# Patient Record
Sex: Male | Born: 1985 | Race: Black or African American | Marital: Married | Smoking: Current every day smoker
Health system: Southern US, Community
[De-identification: ages and names within clinical notes are randomized; demographics above are authoritative.]

## PROBLEM LIST (undated history)

## (undated) DIAGNOSIS — M419 Scoliosis, unspecified: Secondary | ICD-10-CM

## (undated) DIAGNOSIS — D573 Sickle-cell trait: Secondary | ICD-10-CM

## (undated) DIAGNOSIS — F419 Anxiety disorder, unspecified: Secondary | ICD-10-CM

## (undated) DIAGNOSIS — R519 Headache, unspecified: Secondary | ICD-10-CM

## (undated) DIAGNOSIS — F1010 Alcohol abuse, uncomplicated: Secondary | ICD-10-CM

## (undated) DIAGNOSIS — F32A Depression, unspecified: Secondary | ICD-10-CM

## (undated) DIAGNOSIS — E7800 Pure hypercholesterolemia, unspecified: Secondary | ICD-10-CM

## (undated) DIAGNOSIS — K219 Gastro-esophageal reflux disease without esophagitis: Secondary | ICD-10-CM

## (undated) HISTORY — PX: WISDOM TOOTH EXTRACTION: SHX21

## (undated) HISTORY — DX: Gastro-esophageal reflux disease without esophagitis: K21.9

---

## 2016-04-10 ENCOUNTER — Emergency Department
Admission: EM | Admit: 2016-04-10 | Discharge: 2016-04-10 | Disposition: A | Discharge status: Home / Self Care | Payer: Self-pay | Attending: Student in an Organized Health Care Education/Training Program | Admitting: Student in an Organized Health Care Education/Training Program

## 2016-04-10 ENCOUNTER — Encounter: Payer: Self-pay | Admitting: Emergency Medicine

## 2016-04-10 ENCOUNTER — Emergency Department: Payer: Self-pay

## 2016-04-10 DIAGNOSIS — X58XXXA Exposure to other specified factors, initial encounter: Secondary | ICD-10-CM | POA: Insufficient documentation

## 2016-04-10 DIAGNOSIS — Y9339 Activity, other involving climbing, rappelling and jumping off: Secondary | ICD-10-CM | POA: Insufficient documentation

## 2016-04-10 DIAGNOSIS — S9031XA Contusion of right foot, initial encounter: Secondary | ICD-10-CM | POA: Insufficient documentation

## 2016-04-10 DIAGNOSIS — Y929 Unspecified place or not applicable: Secondary | ICD-10-CM | POA: Insufficient documentation

## 2016-04-10 DIAGNOSIS — Y999 Unspecified external cause status: Secondary | ICD-10-CM | POA: Insufficient documentation

## 2016-04-10 MED ORDER — NAPROXEN 500 MG PO TABS: 500.0000 mg | ORAL_TABLET | Freq: Two times a day (BID) | ORAL | 0 refills | Status: DC

## 2016-04-10 NOTE — ED Triage Notes (Signed)
Patient reports right foot pain after jumping onto concrete.  Patient states, "I think I landed wrong."  Patient is in no obvious distress at this time.

## 2016-04-10 NOTE — ED Provider Notes (Signed)
[facility] Emergency Department Provider Note ____________________________________________  Time seen: Approximately 3:53 PM  I have reviewed the triage vital signs and the nursing notes.   HISTORY  Chief Complaint Foot Pain    HPI Keyondre Hepburn is a 31 y.o. male who presents to the emergency department for evaluation of right foot pain after jumping over some mud last night and landing on a curb. Pain increases with ambulation. He has not taken anything for pain.  History reviewed. No pertinent past medical history.  There are no active problems to display for this patient.   History reviewed. No pertinent surgical history.  Prior to Admission medications   Medication Sig Start Date End Date Taking? Authorizing Provider  naproxen (NAPROSYN) 500 MG tablet Take 1 tablet (500 mg total) by mouth 2 (two) times daily with a meal. 04/10/16   Chinita Pester, FNP    Allergies Patient has no known allergies.  No family history on file.  Social History Social History  Substance Use Topics  . Smoking status: Never Smoker  . Smokeless tobacco: Never Used  . Alcohol use No    Review of Systems Constitutional: No recent illness. Cardiovascular: Denies chest pain or palpitations. Respiratory: Denies shortness of breath. Musculoskeletal: Pain in Right foot Skin: Negative for rash, wound, lesion. Neurological: Negative for focal weakness or numbness.  ____________________________________________   PHYSICAL EXAM:  VITAL SIGNS: ED Triage Vitals  Enc Vitals Group     BP 04/10/16 1509 (!) 139/91     Pulse Rate 04/10/16 1509 (!) 115     Resp 04/10/16 1509 20     Temp 04/10/16 1509 98.2 F (36.8 C)     Temp Source 04/10/16 1509 Oral     SpO2 04/10/16 1509 100 %     Weight 04/10/16 1509 160 lb (72.6 kg)     Height 04/10/16 1509 5\' 4"  (1.626 m)     Head Circumference --      Peak Flow --      Pain Score 04/10/16 1507 8     Pain Loc --    Pain Edu? --      Excl. in GC? --     Constitutional: Alert and oriented. Well appearing and in no acute distress. Eyes: Conjunctivae are normal. EOMI. Head: Atraumatic. Neck: No stridor.  Respiratory: Normal respiratory effort.   Musculoskeletal: Right foot demonstrates swelling and tenderness over the fourth and fifth metatarsals with out noted deformity. Neurologic:  Normal speech and language. No gross focal neurologic deficits are appreciated. Speech is normal. No gait instability. Skin:  Skin is warm, dry and intact. Atraumatic. Psychiatric: Mood and affect are normal. Speech and behavior are normal.  ____________________________________________   LABS (all labs ordered are listed, but only abnormal results are displayed)  Labs Reviewed - No data to display ____________________________________________  RADIOLOGY  No acute bony abnormality of the right foot per radiology. ____________________________________________   PROCEDURES  Procedure(s) performed: Ace bandage and cashew applied by ER tech. Patient neurovascularly intact post-application.   ____________________________________________   INITIAL IMPRESSION / ASSESSMENT AND PLAN / ED COURSE     Pertinent labs & imaging results that were available during my care of the patient were reviewed by me and considered in my medical decision making (see chart for details).  31 year old male presents to the emergency department for evaluation of right foot pain. Exam and x-ray are consistent with contusion and sprain. He was instructed to follow-up with podiatry for symptoms that are  not improving over the week. He was instructed to take the Naprosyn as prescribed. He was advised to return to the emergency department for symptoms change or worsen if he is unable schedule an appointment. ____________________________________________   FINAL CLINICAL IMPRESSION(S) / ED DIAGNOSES  Final diagnoses:  Contusion of right foot,  initial encounter       Chinita PesterCari B Anai Lipson, FNP 04/10/16 2149    Willy EddyPatrick Robinson, MD 04/10/16 2359

## 2017-01-16 ENCOUNTER — Encounter: Payer: Self-pay | Admitting: Emergency Medicine

## 2017-01-16 ENCOUNTER — Emergency Department: Payer: Self-pay

## 2017-01-16 ENCOUNTER — Emergency Department
Admission: EM | Admit: 2017-01-16 | Discharge: 2017-01-16 | Disposition: A | Discharge status: Home / Self Care | Payer: Self-pay | Attending: Student in an Organized Health Care Education/Training Program | Admitting: Student in an Organized Health Care Education/Training Program

## 2017-01-16 ENCOUNTER — Other Ambulatory Visit: Payer: Self-pay

## 2017-01-16 DIAGNOSIS — F17210 Nicotine dependence, cigarettes, uncomplicated: Secondary | ICD-10-CM | POA: Insufficient documentation

## 2017-01-16 DIAGNOSIS — R079 Chest pain, unspecified: Secondary | ICD-10-CM | POA: Insufficient documentation

## 2017-01-16 LAB — FIBRIN DERIVATIVES D-DIMER (ARMC ONLY): FIBRIN DERIVATIVES D-DIMER (ARMC): 215.72 ng{FEU}/mL (ref 0.00–499.00)

## 2017-01-16 LAB — COMPREHENSIVE METABOLIC PANEL
ALBUMIN: 4.2 g/dL (ref 3.5–5.0)
ALT: 22 U/L (ref 17–63)
AST: 28 U/L (ref 15–41)
Alkaline Phosphatase: 78 U/L (ref 38–126)
Anion gap: 8 (ref 5–15)
BUN: 12 mg/dL (ref 6–20)
CHLORIDE: 105 mmol/L (ref 101–111)
CO2: 23 mmol/L (ref 22–32)
Calcium: 9.6 mg/dL (ref 8.9–10.3)
Creatinine, Ser: 0.82 mg/dL (ref 0.61–1.24)
GFR calc Af Amer: >60 mL/min (ref >60)
Glucose, Bld: 136 mg/dL — ABNORMAL HIGH (ref 65–99)
POTASSIUM: 3.7 mmol/L (ref 3.5–5.1)
SODIUM: 136 mmol/L (ref 135–145)
Total Bilirubin: 0.6 mg/dL (ref 0.3–1.2)
Total Protein: 7.7 g/dL (ref 6.5–8.1)

## 2017-01-16 LAB — TROPONIN I: Troponin I: <0.03 ng/mL (ref <0.03)

## 2017-01-16 LAB — CBC
HEMATOCRIT: 44.4 % (ref 40.0–52.0)
HEMOGLOBIN: 15.6 g/dL (ref 13.0–18.0)
MCH: 30 pg (ref 26.0–34.0)
MCHC: 35.2 g/dL (ref 32.0–36.0)
MCV: 85.1 fL (ref 80.0–100.0)
PLATELETS: 265 10*3/uL (ref 150–440)
RBC: 5.22 MIL/uL (ref 4.40–5.90)
RDW: 13.7 % (ref 11.5–14.5)
WBC: 6.1 10*3/uL (ref 3.8–10.6)

## 2017-01-16 MED ORDER — TRAMADOL HCL 50 MG PO TABS: 50.0000 mg | ORAL_TABLET | Freq: Four times a day (QID) | ORAL | 0 refills | Status: AC | PRN

## 2017-01-16 MED ORDER — IPRATROPIUM-ALBUTEROL 0.5-2.5 (3) MG/3ML IN SOLN
3.0000 mL | Freq: Once | RESPIRATORY_TRACT | Status: AC
  Administered 2017-01-16: 3 mL via RESPIRATORY_TRACT
  Filled 2017-01-16: qty 3

## 2017-01-16 NOTE — ED Notes (Signed)
Patient called by tech for EKG. Patient is in parking lot on phone and does not respond to summons.

## 2017-01-16 NOTE — ED Provider Notes (Signed)
[facility] Emergency Department Provider Note    First MD Initiated Contact with Patient 01/16/17 1840     (approximate)  I have reviewed the triage vital signs and the nursing notes.   HISTORY  Chief Complaint Generalized Body Aches and Chest Pain    HPI Brian Saunders is a 31 y.o. male with half pack per day smoking history but no other recent admissions to hospital her chronic medical conditions presents with generalized body aches particular on the left side of his hemithorax for the last 4 days.  Pain is worse with deep inspiration and does have some mild shortness of breath.  Denies any chest pain at rest.  Denies any history of blood clots.  Pain is worse with deep inspiration describes it as a stabbing pain.  No cough, fevers, nausea or vomiting.  History reviewed. No pertinent past medical history. No family history on file. History reviewed. No pertinent surgical history. There are no active problems to display for this patient.     Prior to Admission medications   Medication Sig Start Date End Date Taking? Authorizing Provider  naproxen (NAPROSYN) 500 MG tablet Take 1 tablet (500 mg total) by mouth 2 (two) times daily with a meal. 04/10/16   Triplett, Cari B, FNP  traMADol (ULTRAM) 50 MG tablet Take 1 tablet (50 mg total) by mouth every 6 (six) hours as needed. 01/16/17 01/16/18  Willy Eddyobinson, Seabron Iannello, MD    Allergies Patient has no known allergies.    Social History Social History   Tobacco Use  . Smoking status: Current Every Day Smoker    Packs/day: 0.50    Types: Cigarettes  . Smokeless tobacco: Never Used  Substance Use Topics  . Alcohol use: No  . Drug use: Not on file    Review of Systems Patient denies headaches, rhinorrhea, blurry vision, numbness, shortness of breath, chest pain, edema, cough, abdominal pain, nausea, vomiting, diarrhea, dysuria, fevers, rashes or hallucinations unless otherwise stated above in  HPI. ____________________________________________   PHYSICAL EXAM:  VITAL SIGNS: Vitals:   01/16/17 1617  BP: (!) 157/92  Pulse: (!) 103  Resp: 20  Temp: 98.7 F (37.1 C)  SpO2: 100%    Constitutional: Alert and oriented. Well appearing and in no acute distress. Eyes: Conjunctivae are normal.  Head: Atraumatic. Nose: No congestion/rhinnorhea. Mouth/Throat: Mucous membranes are moist.   Neck: No stridor. Painless ROM.  Cardiovascular: Normal rate, regular rhythm. Grossly normal heart sounds.  Good peripheral circulation. Respiratory: Normal respiratory effort.  No retractions. Lungs CTAB. Gastrointestinal: Soft and nontender. No distention. No abdominal bruits. No CVA tenderness. Genitourinary:  Musculoskeletal: No lower extremity tenderness nor edema.  No joint effusions. Neurologic:  Normal speech and language. No gross focal neurologic deficits are appreciated. No facial droop Skin:  Skin is warm, dry and intact. No rash noted. Psychiatric: Mood and affect are normal. Speech and behavior are normal.  ____________________________________________   LABS (all labs ordered are listed, but only abnormal results are displayed)  Results for orders placed or performed during the hospital encounter of 01/16/17 (from the past 24 hour(s))  CBC     Status: None   Collection Time: 01/16/17  4:17 PM  Result Value Ref Range   WBC 6.1 3.8 - 10.6 K/uL   RBC 5.22 4.40 - 5.90 MIL/uL   Hemoglobin 15.6 13.0 - 18.0 g/dL   HCT 56.244.4 13.040.0 - 86.552.0 %   MCV 85.1 80.0 - 100.0 fL   MCH 30.0 26.0 -  34.0 pg   MCHC 35.2 32.0 - 36.0 g/dL   RDW 40.913.7 81.111.5 - 91.414.5 %   Platelets 265 150 - 440 K/uL  Comprehensive metabolic panel     Status: Abnormal   Collection Time: 01/16/17  4:17 PM  Result Value Ref Range   Sodium 136 135 - 145 mmol/L   Potassium 3.7 3.5 - 5.1 mmol/L   Chloride 105 101 - 111 mmol/L   CO2 23 22 - 32 mmol/L   Glucose, Bld 136 (H) 65 - 99 mg/dL   BUN 12 6 - 20 mg/dL    Creatinine, Ser 7.820.82 0.61 - 1.24 mg/dL   Calcium 9.6 8.9 - 95.610.3 mg/dL   Total Protein 7.7 6.5 - 8.1 g/dL   Albumin 4.2 3.5 - 5.0 g/dL   AST 28 15 - 41 U/L   ALT 22 17 - 63 U/L   Alkaline Phosphatase 78 38 - 126 U/L   Total Bilirubin 0.6 0.3 - 1.2 mg/dL   GFR calc non Af Amer >60 >60 mL/min   GFR calc Af Amer >60 >60 mL/min   Anion gap 8 5 - 15  Troponin I     Status: None   Collection Time: 01/16/17  4:17 PM  Result Value Ref Range   Troponin I <0.03 <0.03 ng/mL  Fibrin derivatives D-Dimer ([facility] only)     Status: None   Collection Time: 01/16/17  7:51 PM  Result Value Ref Range   Fibrin derivatives D-dimer ([facility]) 215.72 0.00 - 499.00 ng/mL (FEU)   ____________________________________________  EKG My review and personal interpretation at Time: 16:21   Indication: chest pain  Rate: 100  Rhythm: sinus Axis: normal Other: no stemi, normal intervals, ____________________________________________  RADIOLOGY  I personally reviewed all radiographic images ordered to evaluate for the above acute complaints and reviewed radiology reports and findings.  These findings were personally discussed with the patient.  Please see medical record for radiology report.  ____________________________________________   PROCEDURES  Procedure(s) performed:  Procedures    Critical Care performed: no ____________________________________________   INITIAL IMPRESSION / ASSESSMENT AND PLAN / ED COURSE  Pertinent labs & imaging results that were available during my care of the patient were reviewed by me and considered in my medical decision making (see chart for details).  DDX: ACS, pericarditis, esophagitis, boerhaaves, pe, dissection, pna, bronchitis, costochondritis   Brian Saunders is a 31 y.o. who presents to the ED with chest pain as described above.  Patient is low risk by Wells criteria but is tachycardic therefore d-dimer was sent to further stratify for pulmonary embolism.   D-dimer is negative.  His EKG shows no evidence of acute ischemia and patient is without any pressure or pain at rest.  KG normal after 2 days of pain no exertional pain.  Only with deep inspiration.  Pain is reproducible with palpation.  Most consistent with costochondritis possible pleuritis.  Patient will be discharged with outpatient follow-up.  Discussed signs and symptoms for which he should return immediately to the hospital.     ____________________________________________   FINAL CLINICAL IMPRESSION(S) / ED DIAGNOSES  Final diagnoses:  Chest pain, unspecified type      NEW MEDICATIONS STARTED DURING THIS VISIT:  This SmartLink is deprecated. Use AVSMEDLIST instead to display the medication list for a patient.   Note:  This document was prepared using Dragon voice recognition software and may include unintentional dictation errors.    Willy Eddyobinson, Raekwan Spelman, MD 01/16/17 2115

## 2017-01-16 NOTE — ED Triage Notes (Addendum)
Generalized body aches and pain with inspiration x 4 days. No injury. Patient states he does not have a cough. States pain does not increase with palpation. States pain is "inside".

## 2017-01-16 NOTE — ED Notes (Signed)
Pt presents today with chest pain. Pt states that pain has been occurring for 4 days.  Pt is NAD, but states it hurts when he breaths. Pt is A/O with family at bedside.

## 2017-01-16 NOTE — ED Notes (Signed)
Pt called and did not come right away he was standing up talking, with others in NAD.

## 2017-06-15 ENCOUNTER — Emergency Department
Admission: EM | Admit: 2017-06-15 | Discharge: 2017-06-15 | Disposition: A | Discharge status: Home / Self Care | Payer: Self-pay | Attending: Emergency Medicine | Admitting: Emergency Medicine

## 2017-06-15 ENCOUNTER — Other Ambulatory Visit: Payer: Self-pay

## 2017-06-15 ENCOUNTER — Encounter: Payer: Self-pay | Admitting: Emergency Medicine

## 2017-06-15 DIAGNOSIS — F17210 Nicotine dependence, cigarettes, uncomplicated: Secondary | ICD-10-CM | POA: Insufficient documentation

## 2017-06-15 DIAGNOSIS — H66002 Acute suppurative otitis media without spontaneous rupture of ear drum, left ear: Secondary | ICD-10-CM | POA: Insufficient documentation

## 2017-06-15 DIAGNOSIS — Z79899 Other long term (current) drug therapy: Secondary | ICD-10-CM | POA: Insufficient documentation

## 2017-06-15 MED ORDER — AMOXICILLIN 500 MG PO TABS: 500.0000 mg | ORAL_TABLET | Freq: Three times a day (TID) | ORAL | 0 refills | Status: AC

## 2017-06-15 NOTE — ED Triage Notes (Signed)
Patient ambulatory to triage with steady gait, without difficulty or distress noted; pt c/o left earache and drainage several days

## 2017-06-15 NOTE — ED Notes (Signed)
Pt reports feeling pressure in left ear with no decreased hearing sensation and feels relief when he yawns.

## 2017-06-15 NOTE — ED Provider Notes (Signed)
[facility] Emergency Department Provider Note  ____________________________________________  Time seen: Approximately 10:53 PM  I have reviewed the triage vital signs and the nursing notes.   HISTORY  Chief Complaint Otalgia    HPI Brian Saunders is a 32 y.o. male presents to the emergency department with 10 out of 10 aching and throbbing left ear pain for the past 3 days.  Patient has noticed no discharge from the ear or diminished hearing.  Patient reports symptoms consistent with an upper respiratory tract infection approximately 5 days ago.  He has not noticed fever at home.  No alleviating measures have been attempted.   History reviewed. No pertinent past medical history.  There are no active problems to display for this patient.   History reviewed. No pertinent surgical history.  Prior to Admission medications   Medication Sig Start Date End Date Taking? Authorizing Provider  amoxicillin (AMOXIL) 500 MG tablet Take 1 tablet (500 mg total) by mouth 3 (three) times daily for 7 days. 06/15/17 06/22/17  Orvil FeilWoods, Jaclyn M, PA-C  naproxen (NAPROSYN) 500 MG tablet Take 1 tablet (500 mg total) by mouth 2 (two) times daily with a meal. 04/10/16   Triplett, Cari B, FNP  traMADol (ULTRAM) 50 MG tablet Take 1 tablet (50 mg total) by mouth every 6 (six) hours as needed. 01/16/17 01/16/18  Willy Eddyobinson, Patrick, MD    Allergies Patient has no known allergies.  No family history on file.  Social History Social History   Tobacco Use  . Smoking status: Current Every Day Smoker    Packs/day: 0.50    Types: Cigarettes  . Smokeless tobacco: Never Used  Substance Use Topics  . Alcohol use: No  . Drug use: Not on file     Review of Systems  Constitutional: No fever/chills Eyes: No visual changes. No discharge ENT: Patient has left ear pain.  Cardiovascular: no chest pain. Respiratory: no cough. No SOB. Gastrointestinal: No abdominal pain.  No nausea, no  vomiting.  No diarrhea.  No constipation. Genitourinary: Negative for dysuria. No hematuria Musculoskeletal: Negative for musculoskeletal pain. Skin: Negative for rash, abrasions, lacerations, ecchymosis. Neurological: Negative for headaches, focal weakness or numbness.   ____________________________________________   PHYSICAL EXAM:  VITAL SIGNS: ED Triage Vitals  Enc Vitals Group     BP 06/15/17 1934 (!) 149/84     Pulse Rate 06/15/17 1934 (!) 110     Resp 06/15/17 1934 18     Temp 06/15/17 1934 99.2 F (37.3 C)     Temp Source 06/15/17 1934 Oral     SpO2 06/15/17 1934 100 %     Weight 06/15/17 1933 182 lb (82.6 kg)     Height 06/15/17 1933 5\' 4"  (1.626 m)     Head Circumference --      Peak Flow --      Pain Score 06/15/17 1933 6     Pain Loc --      Pain Edu? --      Excl. in GC? --      Constitutional: Alert and oriented. Well appearing and in no acute distress. Eyes: Conjunctivae are normal. PERRL. EOMI. Head: Atraumatic. ENT:      Ears: Left tympanic membrane is erythematous and effused.  Right TM is pearly.      Nose: No congestion/rhinnorhea.      Mouth/Throat: Mucous membranes are moist.  Cardiovascular: Normal rate, regular rhythm. Normal S1 and S2.  Good peripheral circulation. Respiratory: Normal respiratory effort without tachypnea  or retractions. Lungs CTAB. Good air entry to the bases with no decreased or absent breath sounds. Gastrointestinal: Bowel sounds 4 quadrants. Soft and nontender to palpation. No guarding or rigidity. No palpable masses. No distention. No CVA tenderness. Musculoskeletal: Full range of motion to all extremities. No gross deformities appreciated. Neurologic:  Normal speech and language. No gross focal neurologic deficits are appreciated.  Skin:  Skin is warm, dry and intact. No rash noted.  ___________________________________ LABS (all labs ordered are listed, but only abnormal results are displayed)  Labs Reviewed - No data  to display ____________________________________________  EKG   ____________________________________________  RADIOLOGY   No results found.  ____________________________________________    PROCEDURES  Procedure(s) performed:    Procedures    Medications - No data to display   ____________________________________________   INITIAL IMPRESSION / ASSESSMENT AND PLAN / ED COURSE  Pertinent labs & imaging results that were available during my care of the patient were reviewed by me and considered in my medical decision making (see chart for details).  Review of the [place] CSRS was performed in accordance of the [facility] prior to dispensing any controlled drugs.     Assessment and plan Left otitis media Patient presents to the emergency department with  left ear pain.  Physical exam findings are consistent with otitis media.  Patient was discharged with amoxicillin.  All patient questions were answered.   ____________________________________________  FINAL CLINICAL IMPRESSION(S) / ED DIAGNOSES  Final diagnoses:  Acute suppurative otitis media of left ear without spontaneous rupture of tympanic membrane, recurrence not specified      NEW MEDICATIONS STARTED DURING THIS VISIT:  ED Discharge Orders        Ordered    amoxicillin (AMOXIL) 500 MG tablet  3 times daily     06/15/17 2059          This chart was dictated using voice recognition software/Dragon. Despite best efforts to proofread, errors can occur which can change the meaning. Any change was purely unintentional.    Orvil Feil, PA-C 06/15/17 2257    Don Perking, [place], MD 06/16/17 [id number]

## 2018-02-26 ENCOUNTER — Emergency Department
Admission: EM | Admit: 2018-02-26 | Discharge: 2018-02-26 | Disposition: A | Discharge status: Home / Self Care | Payer: Self-pay | Attending: Emergency Medicine | Admitting: Emergency Medicine

## 2018-02-26 ENCOUNTER — Encounter: Payer: Self-pay | Admitting: Emergency Medicine

## 2018-02-26 ENCOUNTER — Other Ambulatory Visit: Payer: Self-pay

## 2018-02-26 DIAGNOSIS — M67431 Ganglion, right wrist: Secondary | ICD-10-CM | POA: Insufficient documentation

## 2018-02-26 DIAGNOSIS — F17210 Nicotine dependence, cigarettes, uncomplicated: Secondary | ICD-10-CM | POA: Insufficient documentation

## 2018-02-26 MED ORDER — NAPROXEN 500 MG PO TABS: 500.0000 mg | ORAL_TABLET | Freq: Two times a day (BID) | ORAL | Status: DC

## 2018-02-26 NOTE — ED Triage Notes (Signed)
R wrist pain x 3 months. No injury. Small lump back of wrist.

## 2018-02-26 NOTE — ED Provider Notes (Signed)
[facility] Emergency Department Provider Note   ____________________________________________   First MD Initiated Contact with Patient 02/26/18 1335     (approximate)  I have reviewed the triage vital signs and the nursing notes.   HISTORY  Chief Complaint Wrist Pain    HPI Brian Saunders is a 33 y.o. male patient presents with right wrist pain for 3 months.  Patient denies provocative or complaint.  Patient did pain increased secondary to a small lump on the back of his wrist.  Patient is right-hand dominant.  Patient gives a history repetitive flexion extension of the wrist.  Patient also has a history of a ganglion cyst in the same area.  Patient state lesion was aspirated 3 years ago and no complaints until last 3 months.  Patient denies loss sensation or loss of function.  Patient rates his pain as a 2/10.  Patient described the pain is "achy".  No palliative measure for complaint.  History reviewed. No pertinent past medical history.  There are no active problems to display for this patient.   History reviewed. No pertinent surgical history.  Prior to Admission medications   Medication Sig Start Date End Date Taking? Authorizing Provider  naproxen (NAPROSYN) 500 MG tablet Take 1 tablet (500 mg total) by mouth 2 (two) times daily with a meal. 04/10/16   Triplett, Cari B, FNP  naproxen (NAPROSYN) 500 MG tablet Take 1 tablet (500 mg total) by mouth 2 (two) times daily with a meal. 02/26/18   Joni Reining, PA-C    Allergies Patient has no known allergies.  No family history on file.  Social History Social History   Tobacco Use  . Smoking status: Current Every Day Smoker    Packs/day: 0.50    Types: Cigarettes  . Smokeless tobacco: Never Used  Substance Use Topics  . Alcohol use: No  . Drug use: Not on file    Review of Systems Constitutional: No fever/chills Eyes: No visual changes. ENT: No sore throat. Cardiovascular: Denies  chest pain. Respiratory: Denies shortness of breath. Gastrointestinal: No abdominal pain.  No nausea, no vomiting.  No diarrhea.  No constipation. Genitourinary: Negative for dysuria. Musculoskeletal: Right wrist pain. Skin: Negative for rash.  Nodule lesion dorsum right wrist Neurological: Negative for headaches, focal weakness or numbness.   ____________________________________________   PHYSICAL EXAM:  VITAL SIGNS: ED Triage Vitals  Enc Vitals Group     BP 02/26/18 1318 137/89     Pulse Rate 02/26/18 1318 98     Resp 02/26/18 1318 18     Temp 02/26/18 1318 98.4 F (36.9 C)     Temp Source 02/26/18 1318 Oral     SpO2 02/26/18 1318 96 %     Weight 02/26/18 1320 170 lb (77.1 kg)     Height 02/26/18 1320 5\' 5"  (1.651 m)     Head Circumference --      Peak Flow --      Pain Score 02/26/18 1319 2     Pain Loc --      Pain Edu? --      Excl. in GC? --    Constitutional: Alert and oriented. Well appearing and in no acute distress. Cardiovascular: Normal rate, regular rhythm. Grossly normal heart sounds.  Good peripheral circulation. Respiratory: Normal respiratory effort.  No retractions. Lungs CTAB. Musculoskeletal: No obvious deformity to the right wrist/hand.  Patient has full equal range of motion.  Patient is tender palpation dorsal aspect of the  right wrist. Neurologic:  Normal speech and language. No gross focal neurologic deficits are appreciated. No gait instability. Skin:  Skin is warm, dry and intact. No rash noted.  Nodule lesion dorsal aspect of the right wrist. Psychiatric: Mood and affect are normal. Speech and behavior are normal.  ____________________________________________   LABS (all labs ordered are listed, but only abnormal results are displayed)  Labs Reviewed - No data to display ____________________________________________  EKG   ____________________________________________  RADIOLOGY  ED MD interpretation:    Official radiology  report(s): No results found.  ____________________________________________   PROCEDURES  Procedure(s) performed: None  Procedures  Critical Care performed: No  ____________________________________________   INITIAL IMPRESSION / ASSESSMENT AND PLAN / ED COURSE  As part of my medical decision making, I reviewed the following data within the electronic MEDICAL RECORD NUMBER    Patient presents with right wrist pain for 3 months.  Patient has a nodule lesion on the dorsal aspect of the right wrist consistent with ganglion cyst.  Patient given discharge care instruction.  Patient advised follow orthopedic for definitive evaluation and treatment.      ____________________________________________   FINAL CLINICAL IMPRESSION(S) / ED DIAGNOSES  Final diagnoses:  Ganglion cyst of dorsum of right wrist     ED Discharge Orders         Ordered    naproxen (NAPROSYN) 500 MG tablet  2 times daily with meals     02/26/18 1356           Note:  This document was prepared using Dragon voice recognition software and may include unintentional dictation errors.    Joni ReiningSmith, Jasani Lengel K, PA-C 02/26/18 1400    Dionne BucySiadecki, Sebastian, MD 02/26/18 [phone]

## 2018-03-24 ENCOUNTER — Emergency Department
Admission: EM | Admit: 2018-03-24 | Discharge: 2018-03-24 | Disposition: A | Discharge status: Home / Self Care | Payer: Self-pay | Attending: Emergency Medicine | Admitting: Emergency Medicine

## 2018-03-24 ENCOUNTER — Other Ambulatory Visit: Payer: Self-pay

## 2018-03-24 DIAGNOSIS — H1033 Unspecified acute conjunctivitis, bilateral: Secondary | ICD-10-CM | POA: Insufficient documentation

## 2018-03-24 DIAGNOSIS — H109 Unspecified conjunctivitis: Secondary | ICD-10-CM

## 2018-03-24 DIAGNOSIS — F17210 Nicotine dependence, cigarettes, uncomplicated: Secondary | ICD-10-CM | POA: Insufficient documentation

## 2018-03-24 DIAGNOSIS — Z79899 Other long term (current) drug therapy: Secondary | ICD-10-CM | POA: Insufficient documentation

## 2018-03-24 MED ORDER — TETRAHYDROZOLINE-ZN SULFATE 0.05-0.25 % OP SOLN: 2.0000 [drp] | Freq: Three times a day (TID) | OPHTHALMIC | 0 refills | Status: DC | PRN

## 2018-03-24 MED ORDER — ERYTHROMYCIN 5 MG/GM OP OINT: 1.0000 "application " | TOPICAL_OINTMENT | Freq: Four times a day (QID) | OPHTHALMIC | 0 refills | Status: DC

## 2018-03-24 NOTE — ED Notes (Signed)
Gave the pt a cup of water.

## 2018-03-24 NOTE — ED Notes (Signed)
Pt has left eye redness and itchyness, pt states this morning when he woke up left eye was very crusty. He used warm rag to open eye. Pt states he has noticed "eye goo" coming from eye.

## 2018-03-24 NOTE — ED Triage Notes (Signed)
Left eye itching that started at 3AM. Redness. Pt alert and oriented X4, active, cooperative, pt in NAD. RR even and unlabored, color WNL.

## 2018-03-24 NOTE — ED Provider Notes (Signed)
[facility] Emergency Department Provider Note  ____________________________________________  Time seen: Approximately 3:35 PM  I have reviewed the triage vital signs and the nursing notes.   HISTORY  Chief Complaint Eye Problem    HPI Brian Saunders is a 33 y.o. male that presents to the emergency department for evaluation of itching, crusting, and red eye since 3 am. He has still noticed white drainage this morning. Eye has been watering this afternoon. He does not wear contacts or glasses. He works at [employer] and cannot return to [employer] until symptoms have resolved. No foreign body sensation. No eye pain. No visual changes, floaters, flashers, nausea, vomiting.    History reviewed. No pertinent past medical history.  There are no active problems to display for this patient.   History reviewed. No pertinent surgical history.  Prior to Admission medications   Medication Sig Start Date End Date Taking? Authorizing Provider  erythromycin ophthalmic ointment Place 1 application into the right eye 4 (four) times daily. 03/24/18   Enid Derry, PA-C  naproxen (NAPROSYN) 500 MG tablet Take 1 tablet (500 mg total) by mouth 2 (two) times daily with a meal. 04/10/16   Triplett, Cari B, FNP  naproxen (NAPROSYN) 500 MG tablet Take 1 tablet (500 mg total) by mouth 2 (two) times daily with a meal. 02/26/18   Joni Reining, PA-C  tetrahydrozoline-zinc (VISINE-AC) 0.05-0.25 % ophthalmic solution Place 2 drops into the left eye 3 (three) times daily as needed. 03/24/18   Enid Derry, PA-C    Allergies Patient has no known allergies.  No family history on file.  Social History Social History   Tobacco Use  . Smoking status: Current Every Day Smoker    Packs/day: 0.50    Types: Cigarettes  . Smokeless tobacco: Never Used  Substance Use Topics  . Alcohol use: No  . Drug use: Not on file     Review of Systems  Constitutional: No  fever/chills Cardiovascular: No chest pain. Respiratory: No SOB. Gastrointestinal: No nausea, no vomiting.  Musculoskeletal: Negative for musculoskeletal pain. Skin: Negative for rash, abrasions, lacerations, ecchymosis. Neurological: Negative for headaches   ____________________________________________   PHYSICAL EXAM:  VITAL SIGNS: ED Triage Vitals [03/24/18 1315]  Enc Vitals Group     BP (!) 126/91     Pulse Rate 100     Resp 16     Temp 98.7 F (37.1 C)     Temp Source Oral     SpO2 98 %     Weight 182 lb (82.6 kg)     Height 5\' 4"  (1.626 m)     Head Circumference      Peak Flow      Pain Score 0     Pain Loc      Pain Edu?      Excl. in GC?      Constitutional: Alert and oriented. Well appearing and in no acute distress. Eyes: Right conjunctivae is normal. PERRL. EOMI. left conjunctiva is mildly injected. Head: Atraumatic. ENT:      Ears:      Nose: No congestion/rhinnorhea.      Mouth/Throat: Mucous membranes are moist.  Neck: No stridor.   Cardiovascular: Normal rate, regular rhythm.  Good peripheral circulation. Respiratory: Normal respiratory effort without tachypnea or retractions. Lungs CTAB. Good air entry to the bases with no decreased or absent breath sounds. Gastrointestinal: Bowel sounds 4 quadrants. Soft and nontender to palpation. No guarding or rigidity. No palpable masses.  No distention.  Musculoskeletal: Full range of motion to all extremities. No gross deformities appreciated. Neurologic:  Normal speech and language. No gross focal neurologic deficits are appreciated.  Skin:  Skin is warm, dry and intact. No rash noted. Psychiatric: Mood and affect are normal. Speech and behavior are normal. Patient exhibits appropriate insight and judgement.   ____________________________________________   LABS (all labs ordered are listed, but only abnormal results are displayed)  Labs Reviewed - No data to  display ____________________________________________  EKG   ____________________________________________  RADIOLOGY   No results found.  ____________________________________________    PROCEDURES  Procedure(s) performed:    Procedures    Medications - No data to display   ____________________________________________   INITIAL IMPRESSION / ASSESSMENT AND PLAN / ED COURSE  Pertinent labs & imaging results that were available during my care of the patient were reviewed by me and considered in my medical decision making (see chart for details).  Review of the [place] CSRS was performed in accordance of the N[facility] prior to dispensing any controlled drugs.     Patient's diagnosis is consistent with conjunctivitis.  Vital signs and exam are reassuring.  Conjunctivitis is likely viral but patient will be covered for bacterial infection since he did notice white drainage this morning.  Patient will be discharged home with prescriptions for erythromycin ointment and Visine. Patient is to follow up with ophthalmology as directed. Patient is given ED precautions to return to the ED for any worsening or new symptoms.     ____________________________________________  FINAL CLINICAL IMPRESSION(S) / ED DIAGNOSES  Final diagnoses:  Conjunctivitis of both eyes, unspecified conjunctivitis type      NEW MEDICATIONS STARTED DURING THIS VISIT:  ED Discharge Orders         Ordered    erythromycin ophthalmic ointment  4 times daily     03/24/18 1604    tetrahydrozoline-zinc (VISINE-AC) 0.05-0.25 % ophthalmic solution  3 times daily PRN     03/24/18 1604              This chart was dictated using voice recognition software/Dragon. Despite best efforts to proofread, errors can occur which can change the meaning. Any change was purely unintentional.    Enid DerryWagner, Makenzi Bannister, PA-C 03/24/18 1622    Arnaldo NatalMalinda, Paul F, MD 03/25/18 1400

## 2018-04-26 ENCOUNTER — Emergency Department: Payer: Self-pay

## 2018-04-26 ENCOUNTER — Encounter: Payer: Self-pay | Admitting: Emergency Medicine

## 2018-04-26 ENCOUNTER — Other Ambulatory Visit: Payer: Self-pay

## 2018-04-26 ENCOUNTER — Emergency Department
Admission: EM | Admit: 2018-04-26 | Discharge: 2018-04-26 | Disposition: A | Discharge status: Home / Self Care | Payer: Self-pay | Attending: Emergency Medicine | Admitting: Emergency Medicine

## 2018-04-26 DIAGNOSIS — R0789 Other chest pain: Secondary | ICD-10-CM | POA: Insufficient documentation

## 2018-04-26 DIAGNOSIS — F17210 Nicotine dependence, cigarettes, uncomplicated: Secondary | ICD-10-CM | POA: Insufficient documentation

## 2018-04-26 LAB — CBC
HCT: 43.8 % (ref 39.0–52.0)
Hemoglobin: 15.2 g/dL (ref 13.0–17.0)
MCH: 28.8 pg (ref 26.0–34.0)
MCHC: 34.7 g/dL (ref 30.0–36.0)
MCV: 83 fL (ref 80.0–100.0)
Platelets: 320 10*3/uL (ref 150–400)
RBC: 5.28 MIL/uL (ref 4.22–5.81)
RDW: 12.7 % (ref 11.5–15.5)
WBC: 5.1 10*3/uL (ref 4.0–10.5)
nRBC: 0 % (ref 0.0–0.2)

## 2018-04-26 LAB — BASIC METABOLIC PANEL
Anion gap: 8 (ref 5–15)
BUN: 11 mg/dL (ref 6–20)
CO2: 23 mmol/L (ref 22–32)
CREATININE: 0.95 mg/dL (ref 0.61–1.24)
Calcium: 8.7 mg/dL — ABNORMAL LOW (ref 8.9–10.3)
Chloride: 106 mmol/L (ref 98–111)
GFR calc Af Amer: >60 mL/min (ref >60)
GLUCOSE: 107 mg/dL — AB (ref 70–99)
POTASSIUM: 4.3 mmol/L (ref 3.5–5.1)
Sodium: 137 mmol/L (ref 135–145)

## 2018-04-26 LAB — TROPONIN I

## 2018-04-26 MED ORDER — NAPROXEN 500 MG PO TABS: 500.0000 mg | ORAL_TABLET | Freq: Two times a day (BID) | ORAL | 2 refills | Status: DC

## 2018-04-26 MED ORDER — KETOROLAC TROMETHAMINE 30 MG/ML IJ SOLN
30.0000 mg | Freq: Once | INTRAMUSCULAR | Status: AC
  Administered 2018-04-26: 30 mg via INTRAMUSCULAR

## 2018-04-26 MED ORDER — KETOROLAC TROMETHAMINE 30 MG/ML IJ SOLN
INTRAMUSCULAR | Status: AC
  Administered 2018-04-26: 30 mg via INTRAMUSCULAR
  Filled 2018-04-26: qty 1

## 2018-04-26 NOTE — ED Notes (Signed)
Pt verbalizes d/c understanding, follow up and RX. PT unable to sidng due to signature pad malfnx

## 2018-04-26 NOTE — ED Triage Notes (Signed)
Pt presents to ED via POV c/o L-sided rib pain that radiates into chest. Pt states pain is worsened by deep breathing.

## 2018-04-26 NOTE — ED Provider Notes (Signed)
[facility] Emergency Department Provider Note   ____________________________________________    I have reviewed the triage vital signs and the nursing notes.   HISTORY  Chief Complaint Chest Pain     HPI Brian Saunders is a 33 y.o. male who presents with complaints of left lateral chest pain underneath his left axilla.  He reports the pain only occurs when he lifts his left arm over his head and he feels a tightness and straining sensation.  He also reports if he takes a very deep breath he has pain in the same area.  Denies injury to the area.  No fevers or chills.  No rash.  No leg swelling or pain.  No recent travel.  No nausea vomiting or diaphoresis.  Has not take anything for this  History reviewed. No pertinent past medical history.  There are no active problems to display for this patient.   History reviewed. No pertinent surgical history.  Prior to Admission medications   Medication Sig Start Date End Date Taking? Authorizing Provider  erythromycin ophthalmic ointment Place 1 application into the right eye 4 (four) times daily. 03/24/18   Enid Derry, PA-C  naproxen (NAPROSYN) 500 MG tablet Take 1 tablet (500 mg total) by mouth 2 (two) times daily with a meal. 04/26/18   Jene Every, MD  tetrahydrozoline-zinc (VISINE-AC) 0.05-0.25 % ophthalmic solution Place 2 drops into the left eye 3 (three) times daily as needed. 03/24/18   Enid Derry, PA-C     Allergies Patient has no known allergies.  History reviewed. No pertinent family history.  Social History Social History   Tobacco Use  . Smoking status: Current Every Day Smoker    Packs/day: 0.50    Types: Cigarettes  . Smokeless tobacco: Never Used  Substance Use Topics  . Alcohol use: No  . Drug use: Not on file    Review of Systems  Constitutional: No fever/chills Eyes: No visual changes.  ENT: No sore throat. Cardiovascular: As above Respiratory: Denies  shortness of breath. Gastrointestinal: No abdominal pain.  No nausea, no vomiting.   Genitourinary: Negative for dysuria. Musculoskeletal: Negative for back pain. Skin: Negative for rash. Neurological: Negative for headaches   ____________________________________________   PHYSICAL EXAM:  VITAL SIGNS: ED Triage Vitals  Enc Vitals Group     BP 04/26/18 1521 118/85     Pulse Rate 04/26/18 1521 98     Resp 04/26/18 1521 18     Temp 04/26/18 1521 98 F (36.7 C)     Temp Source 04/26/18 1521 Oral     SpO2 04/26/18 1521 97 %     Weight 04/26/18 1522 82.1 kg (181 lb)     Height 04/26/18 1522 1.626 m (5\' 4" )     Head Circumference --      Peak Flow --      Pain Score 04/26/18 1522 8     Pain Loc --      Pain Edu? --      Excl. in GC? --     Constitutional: Alert and oriented. N Eyes: Conjunctivae are normal.   Nose: No congestion/rhinnorhea. Mouth/Throat: Mucous membranes are moist.    Cardiovascular: Normal rate, regular rhythm. Grossly normal heart sounds.  Good peripheral circulation.  Point tenderness to palpation just inferior to the left axilla, no swelling bruising or rash Respiratory: Normal respiratory effort.  No retractions. Lungs CTAB. Gastrointestinal: Soft and nontender. No distention.    Musculoskeletal:.  Warm and well perfused  Neurologic:  Normal speech and language. No gross focal neurologic deficits are appreciated.  Skin:  Skin is warm, dry and intact. No rash noted. Psychiatric: Mood and affect are normal. Speech and behavior are normal.  ____________________________________________   LABS (all labs ordered are listed, but only abnormal results are displayed)  Labs Reviewed  BASIC METABOLIC PANEL - Abnormal; Notable for the following components:      Result Value   Glucose, Bld 107 (*)    Calcium 8.7 (*)    All other components within normal limits  CBC  TROPONIN I   ____________________________________________  EKG  ED ECG REPORT I,  Jene Every, the attending physician, personally viewed and interpreted this ECG.  Date: 04/26/2018  Rhythm: normal sinus rhythm QRS Axis: normal Intervals: normal ST/T Wave abnormalities: normal Narrative Interpretation: no evidence of acute ischemia  ____________________________________________  RADIOLOGY  Chest x-ray unremarkable ____________________________________________   PROCEDURES  Procedure(s) performed: No  Procedures   Critical Care performed: No ____________________________________________   INITIAL IMPRESSION / ASSESSMENT AND PLAN / ED COURSE  Pertinent labs & imaging results that were available during my care of the patient were reviewed by me and considered in my medical decision making (see chart for details).  Patient presents with chest wall pain as described above, his symptoms and exam not consistent with ACS dissection or PE.  Lab work is quite reassuring, EKG is normal.  Chest x-ray is benign.  Very clearly chest wall pain.  Treated with IM Toradol will discharge with Naprosyn outpatient follow-up as needed    ____________________________________________   FINAL CLINICAL IMPRESSION(S) / ED DIAGNOSES  Final diagnoses:  Chest wall pain        Note:  This document was prepared using Dragon voice recognition software and may include unintentional dictation errors.   Jene Every, MD 04/26/18 [phone]

## 2018-05-09 ENCOUNTER — Emergency Department
Admission: EM | Admit: 2018-05-09 | Discharge: 2018-05-09 | Disposition: A | Discharge status: Home / Self Care | Payer: Self-pay | Attending: Emergency Medicine | Admitting: Emergency Medicine

## 2018-05-09 ENCOUNTER — Other Ambulatory Visit: Payer: Self-pay

## 2018-05-09 ENCOUNTER — Encounter: Payer: Self-pay | Admitting: Intensive Care

## 2018-05-09 DIAGNOSIS — R197 Diarrhea, unspecified: Secondary | ICD-10-CM | POA: Insufficient documentation

## 2018-05-09 DIAGNOSIS — R112 Nausea with vomiting, unspecified: Secondary | ICD-10-CM | POA: Insufficient documentation

## 2018-05-09 DIAGNOSIS — F17210 Nicotine dependence, cigarettes, uncomplicated: Secondary | ICD-10-CM | POA: Insufficient documentation

## 2018-05-09 HISTORY — DX: Sickle-cell trait: D57.3

## 2018-05-09 HISTORY — DX: Scoliosis, unspecified: M41.9

## 2018-05-09 MED ORDER — ONDANSETRON 4 MG PO TBDP: 4.0000 mg | ORAL_TABLET | Freq: Three times a day (TID) | ORAL | 0 refills | Status: DC | PRN

## 2018-05-09 NOTE — ED Notes (Signed)

## 2018-05-09 NOTE — Discharge Instructions (Addendum)
Follow-up with your regular doctor if not better in 3 days.  Return emergency department worsening.  Drink plenty of fluids.  Take Zofran for nausea and vomiting.  Over-the-counter Imodium for diarrhea.

## 2018-05-09 NOTE — ED Provider Notes (Signed)
[facility] Emergency Department Provider Note  ____________________________________________   First MD Initiated Contact with Patient 05/09/18 1221     (approximate)  I have reviewed the triage vital signs and the nursing notes.   HISTORY  Chief Complaint Diarrhea    HPI Brian Saunders is a 33 y.o. male Pt c/o vomiting and diarrhea, sx for 1 days, no fever/chills, no abd pain except for cramping with diarrhea; denies cp/sob, denies camping, bad food, recent antibiotics, or exposure to bad water, patient states he has had watery diarrhea with multiple episodes today that started at 4 AM and 5-6 episodes of vomiting which were prevalent yesterday.   Past Medical History:  Diagnosis Date  . Scoliosis   . Sickle cell trait (HCC)     There are no active problems to display for this patient.   History reviewed. No pertinent surgical history.  Prior to Admission medications   Medication Sig Start Date End Date Taking? Authorizing Provider  erythromycin ophthalmic ointment Place 1 application into the right eye 4 (four) times daily. 03/24/18   Enid Derry, PA-C  naproxen (NAPROSYN) 500 MG tablet Take 1 tablet (500 mg total) by mouth 2 (two) times daily with a meal. 04/26/18   Jene Every, MD  ondansetron (ZOFRAN-ODT) 4 MG disintegrating tablet Take 1 tablet (4 mg total) by mouth every 8 (eight) hours as needed. 05/09/18   Fisher, Roselyn Bering, PA-C  tetrahydrozoline-zinc (VISINE-AC) 0.05-0.25 % ophthalmic solution Place 2 drops into the left eye 3 (three) times daily as needed. 03/24/18   Enid Derry, PA-C    Allergies Patient has no known allergies.  History reviewed. No pertinent family history.  Social History Social History   Tobacco Use  . Smoking status: Current Every Day Smoker    Packs/day: 0.50    Types: Cigarettes  . Smokeless tobacco: Never Used  Substance Use Topics  . Alcohol use: Yes    Alcohol/week: 9.0 standard drinks   Types: 9 Cans of beer per week  . Drug use: Not Currently    Review of Systems  Constitutional: No fever/chills Eyes: No visual changes. ENT: No sore throat. Respiratory: Denies cough Gastrointestinal: Positive for vomiting and diarrhea Genitourinary: Negative for dysuria. Musculoskeletal: Negative for back pain. Skin: Negative for rash.    ____________________________________________   PHYSICAL EXAM:  VITAL SIGNS: ED Triage Vitals  Enc Vitals Group     BP 05/09/18 1213 (!) 140/96     Pulse Rate 05/09/18 1213 99     Resp 05/09/18 1213 14     Temp 05/09/18 1213 98.3 F (36.8 C)     Temp Source 05/09/18 1213 Oral     SpO2 05/09/18 1213 98 %     Weight 05/09/18 1214 186 lb (84.4 kg)     Height 05/09/18 1214 5\' 4"  (1.626 m)     Head Circumference --      Peak Flow --      Pain Score 05/09/18 1214 0     Pain Loc --      Pain Edu? --      Excl. in GC? --     Constitutional: Alert and oriented. Well appearing and in no acute distress. Eyes: Conjunctivae are normal.  Head: Atraumatic. Nose: No congestion/rhinnorhea. Mouth/Throat: Mucous membranes are moist.   Neck:  supple no lymphadenopathy noted Cardiovascular: Normal rate, regular rhythm. Heart sounds are normal Respiratory: Normal respiratory effort.  No retractions, lungs c t a  Abd: soft nontender bs normal  all 4 quad GU: deferred Musculoskeletal: FROM all extremities, warm and well perfused Neurologic:  Normal speech and language.  Skin:  Skin is warm, dry and intact. No rash noted. Psychiatric: Mood and affect are normal. Speech and behavior are normal.  ____________________________________________   LABS (all labs ordered are listed, but only abnormal results are displayed)  Labs Reviewed - No data to display ____________________________________________   ____________________________________________  RADIOLOGY    ____________________________________________   PROCEDURES  Procedure(s)  performed: No  Procedures    ____________________________________________   INITIAL IMPRESSION / ASSESSMENT AND PLAN / ED COURSE  Pertinent labs & imaging results that were available during my care of the patient were reviewed by me and considered in my medical decision making (see chart for details).   Patient is a 33 year old male presents emergency department for 1 day of vomiting and diarrhea.  Physical exam patient's vitals are normal.  He does appear well and nontoxic.  Abdomen is soft and tender along the left and right lower quadrants.  Explained the findings to the patient.  Told this is a viral illness.  This is not coronavirus in which she was concerned about.  Told him the mass hysteria of pain toilet paper has nothing to do with actual coronavirus.  He was told to take Imodium for diarrhea, he was given a prescription for Zofran for nausea/vomiting.  He is to follow-up with his regular doctor if not better in 3 days.  He was given a work note as requested.  Is discharged stable condition.     As part of my medical decision making, I reviewed the following data within the electronic MEDICAL RECORD NUMBER Nursing notes reviewed and incorporated, Old chart reviewed, Notes from prior ED visits and [place] Controlled Substance Database  ____________________________________________   FINAL CLINICAL IMPRESSION(S) / ED DIAGNOSES  Final diagnoses:  Nausea vomiting and diarrhea      NEW MEDICATIONS STARTED DURING THIS VISIT:  New Prescriptions   ONDANSETRON (ZOFRAN-ODT) 4 MG DISINTEGRATING TABLET    Take 1 tablet (4 mg total) by mouth every 8 (eight) hours as needed.     Note:  This document was prepared using Dragon voice recognition software and may include unintentional dictation errors.     Faythe Ghee, PA-C 05/09/18 1245    Sharyn Creamer, MD 05/09/18 [phone]

## 2018-05-09 NOTE — ED Triage Notes (Signed)
Patient c/o diarrhea starting last night and episode of emesis X1. Denies fevers. Denies pain. Patient reports his work made him leave after he threw up once at work and wanted him to get a work note to be cleared to come back to work

## 2018-06-13 ENCOUNTER — Other Ambulatory Visit: Payer: Self-pay

## 2018-06-13 ENCOUNTER — Emergency Department: Payer: Self-pay

## 2018-06-13 ENCOUNTER — Encounter: Payer: Self-pay | Admitting: Emergency Medicine

## 2018-06-13 ENCOUNTER — Emergency Department
Admission: EM | Admit: 2018-06-13 | Discharge: 2018-06-13 | Disposition: A | Discharge status: Home / Self Care | Payer: Self-pay | Attending: Emergency Medicine | Admitting: Emergency Medicine

## 2018-06-13 DIAGNOSIS — F17210 Nicotine dependence, cigarettes, uncomplicated: Secondary | ICD-10-CM | POA: Insufficient documentation

## 2018-06-13 DIAGNOSIS — M25562 Pain in left knee: Secondary | ICD-10-CM | POA: Insufficient documentation

## 2018-06-13 MED ORDER — MELOXICAM 15 MG PO TABS: 15.0000 mg | ORAL_TABLET | Freq: Every day | ORAL | 0 refills | Status: DC

## 2018-06-13 NOTE — ED Provider Notes (Signed)
[facility] Emergency Department Provider Note  ____________________________________________  Time seen: Approximately 5:09 PM  I have reviewed the triage vital signs and the nursing notes.   HISTORY  Chief Complaint Knee Pain    HPI Brian Saunders is a 33 y.o. male who presents emergency department complaining of left knee pain.  Patient reports that approximately a week ago he developed nontraumatic left knee pain with mild edema.  Patient reports that the edema was around his kneecap.  He was still able to ambulate without difficulty.  Patient presents today stating that the pain and swelling has "gone away already" but he "just wants to know what caused it."  No other injury or complaint is noted.  No history of recurrent knee problems or previous injury or surgery to the left knee.  Patient took no medications for his complaint prior to arrival.         Past Medical History:  Diagnosis Date  . Scoliosis   . Sickle cell trait (HCC)     There are no active problems to display for this patient.   History reviewed. No pertinent surgical history.  Prior to Admission medications   Medication Sig Start Date End Date Taking? Authorizing Provider  meloxicam (MOBIC) 15 MG tablet Take 1 tablet (15 mg total) by mouth daily. 06/13/18   Araf Clugston, Delorise RoyalsJonathan D, PA-C  naproxen (NAPROSYN) 500 MG tablet Take 1 tablet (500 mg total) by mouth 2 (two) times daily with a meal. 04/26/18   Jene EveryKinner, Robert, MD  ondansetron (ZOFRAN-ODT) 4 MG disintegrating tablet Take 1 tablet (4 mg total) by mouth every 8 (eight) hours as needed. 05/09/18   Faythe GheeFisher, Susan W, PA-C    Allergies Patient has no known allergies.  No family history on file.  Social History Social History   Tobacco Use  . Smoking status: Current Every Day Smoker    Packs/day: 0.50    Types: Cigarettes  . Smokeless tobacco: Never Used  Substance Use Topics  . Alcohol use: Yes    Alcohol/week: 9.0  standard drinks    Types: 9 Cans of beer per week  . Drug use: Not Currently     Review of Systems  Constitutional: No fever/chills Eyes: No visual changes.  Cardiovascular: no chest pain. Respiratory: no cough. No SOB. Gastrointestinal: No abdominal pain.  No nausea, no vomiting.  Musculoskeletal: Positive for left knee pain and edema but has resolved. Skin: Negative for rash, abrasions, lacerations, ecchymosis. Neurological: Negative for headaches, focal weakness or numbness. 10-point ROS otherwise negative.  ____________________________________________   PHYSICAL EXAM:  VITAL SIGNS: ED Triage Vitals  Enc Vitals Group     BP 06/13/18 1618 (!) 120/93     Pulse Rate 06/13/18 1618 (!) 103     Resp 06/13/18 1618 16     Temp 06/13/18 1618 98.9 F (37.2 C)     Temp Source 06/13/18 1618 Oral     SpO2 06/13/18 1618 95 %     Weight 06/13/18 1612 182 lb (82.6 kg)     Height 06/13/18 1612 5\' 4"  (1.626 m)     Head Circumference --      Peak Flow --      Pain Score 06/13/18 1612 7     Pain Loc --      Pain Edu? --      Excl. in GC? --      Constitutional: Alert and oriented. Well appearing and in no acute distress. Eyes: Conjunctivae are normal. PERRL.  EOMI. Head: Atraumatic. Neck: No stridor.  Cardiovascular: Normal rate, regular rhythm. Normal S1 and S2.  Good peripheral circulation. Respiratory: Normal respiratory effort without tachypnea or retractions. Lungs CTAB. Good air entry to the bases with no decreased or absent breath sounds. Musculoskeletal: Full range of motion to all extremities. No gross deformities appreciated.  No erythema, edema, deformity noted to the left knee.  Full range of motion to left knee.  Patient ambulates on the knee without difficulty.  Negative for ballottement.  Varus, valgus, Lachman's, McMurray's is negative.  Dorsalis pedis pulse and sensation intact distally. Neurologic:  Normal speech and language. No gross focal neurologic deficits are  appreciated.  Skin:  Skin is warm, dry and intact. No rash noted. Psychiatric: Mood and affect are normal. Speech and behavior are normal. Patient exhibits appropriate insight and judgement.   ____________________________________________   LABS (all labs ordered are listed, but only abnormal results are displayed)  Labs Reviewed - No data to display ____________________________________________  EKG   ____________________________________________  RADIOLOGY I personally viewed and evaluated these images as part of my medical decision making, as well as reviewing the written report by the radiologist.  Dg Knee Complete 4 Views Left  Result Date: 06/13/2018 CLINICAL DATA:  Acute left knee pain without known injury. EXAM: LEFT KNEE - COMPLETE 4+ VIEW COMPARISON:  None. FINDINGS: No evidence of fracture, dislocation, or joint effusion. No evidence of arthropathy or other focal bone abnormality. Soft tissues are unremarkable. IMPRESSION: Negative. Electronically Signed   By: Lupita Raider M.D.   On: 06/13/2018 17:44    ____________________________________________    PROCEDURES  Procedure(s) performed:    Procedures    Medications - No data to display   ____________________________________________   INITIAL IMPRESSION / ASSESSMENT AND PLAN / ED COURSE  Pertinent labs & imaging results that were available during my care of the patient were reviewed by me and considered in my medical decision making (see chart for details).  Review of the [place] CSRS was performed in accordance of the [facility] prior to dispensing any controlled drugs.      Patient's diagnosis is consistent with left knee pain.  Patient presented to the emergency department with a complaint of pain to the left knee.  Patient reports that he had pain and swelling approximately a week ago that has fully resolved.  Patient presented "just to find out why it became swollen."  Exam is reassuring.  No concerning  findings on x-ray.  Patient will be placed on meloxicam for symptom relief and advised to follow-up with orthopedics if symptoms return.  Patient is given ED precautions to return to the ED for any worsening or new symptoms.     ____________________________________________  FINAL CLINICAL IMPRESSION(S) / ED DIAGNOSES  Final diagnoses:  Acute pain of left knee      NEW MEDICATIONS STARTED DURING THIS VISIT:  ED Discharge Orders         Ordered    meloxicam (MOBIC) 15 MG tablet  Daily     06/13/18 1821              This chart was dictated using voice recognition software/Dragon. Despite best efforts to proofread, errors can occur which can change the meaning. Any change was purely unintentional.    Racheal Patches, PA-C 06/13/18 Orbie Hurst, MD 06/13/18 2145

## 2018-06-13 NOTE — ED Triage Notes (Signed)
Patient presents to the ED with left knee pain x 1 week.  Patient states he had swelling to his knee earlier in the week but states swelling has completely resolved.  Patient states pain has somewhat improved.  States pan is, "irritating".  Patient is in no obvious distress at this time.

## 2018-10-17 ENCOUNTER — Encounter: Payer: Self-pay | Admitting: Emergency Medicine

## 2018-10-17 ENCOUNTER — Emergency Department
Admission: EM | Admit: 2018-10-17 | Discharge: 2018-10-17 | Disposition: A | Discharge status: Home / Self Care | Payer: Self-pay | Attending: Emergency Medicine | Admitting: Emergency Medicine

## 2018-10-17 ENCOUNTER — Emergency Department: Payer: Self-pay

## 2018-10-17 ENCOUNTER — Other Ambulatory Visit: Payer: Self-pay

## 2018-10-17 DIAGNOSIS — F17210 Nicotine dependence, cigarettes, uncomplicated: Secondary | ICD-10-CM | POA: Insufficient documentation

## 2018-10-17 DIAGNOSIS — G4730 Sleep apnea, unspecified: Secondary | ICD-10-CM | POA: Insufficient documentation

## 2018-10-17 NOTE — ED Triage Notes (Signed)
First Rn Note: Pt presents to ED via POV, states he feels like he stops breathing during the night and wakes up gasping for breath. Pt is A&O x4, NAD noted at this time, respirations even and unlabored at this time.

## 2018-10-17 NOTE — ED Notes (Signed)
Patient transported to X-ray 

## 2018-10-17 NOTE — ED Provider Notes (Signed)
[facility] Emergency Department Provider Note   ____________________________________________    I have reviewed the triage vital signs and the nursing notes.   HISTORY  Chief Complaint Sleep Apnea     HPI Brian Saunders is a 33 y.o. male who presents with complaints of a sensation of shortness of breath shortly after falling asleep which is occurred the last 2 nights.  He notes that 30 minutes after he falls asleep he wakes up feeling like he cannot breathe.  He is unable to go back to sleep normally.  Normal breathing during the day.  He does smoke cigarettes.  No pleurisy or chest pain.  No calf pain or swelling.  No history of sleep apnea.  He does snore.  Past Medical History:  Diagnosis Date  . Scoliosis   . Sickle cell trait (HCC)     There are no active problems to display for this patient.   History reviewed. No pertinent surgical history.  Prior to Admission medications   Medication Sig Start Date End Date Taking? Authorizing Provider  meloxicam (MOBIC) 15 MG tablet Take 1 tablet (15 mg total) by mouth daily. 06/13/18   Cuthriell, Delorise RoyalsJonathan D, PA-C  naproxen (NAPROSYN) 500 MG tablet Take 1 tablet (500 mg total) by mouth 2 (two) times daily with a meal. 04/26/18   Jene EveryKinner, Laisha Rau, MD  ondansetron (ZOFRAN-ODT) 4 MG disintegrating tablet Take 1 tablet (4 mg total) by mouth every 8 (eight) hours as needed. 05/09/18   Faythe GheeFisher, Susan W, PA-C     Allergies Patient has no known allergies.  No family history on file.  Social History Social History   Tobacco Use  . Smoking status: Current Every Day Smoker    Packs/day: 0.50    Types: Cigarettes  . Smokeless tobacco: Never Used  Substance Use Topics  . Alcohol use: Yes    Alcohol/week: 9.0 standard drinks    Types: 9 Cans of beer per week  . Drug use: Not Currently    Review of Systems  Constitutional: No fever/chills Eyes: No visual changes.  ENT: No sore throat.  Cardiovascular: Denies chest pain. Respiratory: As above Gastrointestinal: No abdominal pain.  No nausea, no vomiting.   Genitourinary: Negative for dysuria. Musculoskeletal: Negative for back pain. Skin: Negative for rash. Neurological: Negative for headaches or weakness   ____________________________________________   PHYSICAL EXAM:  VITAL SIGNS: ED Triage Vitals [10/17/18 1610]  Enc Vitals Group     BP 125/77     Pulse Rate (!) 112     Resp 18     Temp 99 F (37.2 C)     Temp Source Oral     SpO2 95 %     Weight 86.6 kg (191 lb)     Height 1.626 m (5\' 4" )     Head Circumference      Peak Flow      Pain Score 0     Pain Loc      Pain Edu?      Excl. in GC?     Constitutional: Alert and oriented.   ENT: Pharynx normal, normal uvula Cardiovascular: Normal rate, normalized from triage, regular rhythm. Grossly normal heart sounds.  Good peripheral circulation. Respiratory: Normal respiratory effort.  No retractions. Lungs CTAB. Gastrointestinal: Soft and nontender. No distention.  No CVA tenderness.  Musculoskeletal: No lower extremity tenderness nor edema.  Warm and well perfused Neurologic:  Normal speech and language. No gross focal neurologic deficits are appreciated.  Skin:  Skin is warm, dry and intact. No rash noted. Psychiatric: Mood and affect are normal. Speech and behavior are normal.  ____________________________________________   LABS (all labs ordered are listed, but only abnormal results are displayed)  Labs Reviewed - No data to display ____________________________________________  EKG  ED ECG REPORT I, Lavonia Drafts, the attending physician, personally viewed and interpreted this ECG.  Date: 10/17/2018  Rhythm: Sinus tachycardia QRS Axis: normal Intervals: normal ST/T Wave abnormalities: normal Narrative Interpretation: no evidence of acute ischemia  ____________________________________________  RADIOLOGY  Chest x-ray benign  ____________________________________________   PROCEDURES  Procedure(s) performed: No  Procedures   Critical Care performed: No ____________________________________________   INITIAL IMPRESSION / ASSESSMENT AND PLAN / ED COURSE  Pertinent labs & imaging results that were available during my care of the patient were reviewed by me and considered in my medical decision making (see chart for details).  Patient with a sensation of shortness of breath shortly after falling asleep over the last 2 nights, however the rest of the day he feels quite well and has no complaints.  He is well-appearing here with normal chest x-ray.  EKG demonstrates sinus tachycardia which he attributes to anxiety.  Heart rate normalized at a time he was in the room.  No calf pain or swelling, no pleurisy.  Recommend outpatient follow-up consideration of sleep study as needed, return precautions discussed    ____________________________________________   FINAL CLINICAL IMPRESSION(S) / ED DIAGNOSES  Final diagnoses:  Sleep apnea, unspecified type        Note:  This document was prepared using Dragon voice recognition software and may include unintentional dictation errors.   Lavonia Drafts, MD 10/17/18 [phone]

## 2018-11-29 ENCOUNTER — Other Ambulatory Visit: Payer: Self-pay

## 2018-11-29 DIAGNOSIS — Z20822 Contact with and (suspected) exposure to covid-19: Secondary | ICD-10-CM

## 2018-11-29 DIAGNOSIS — Z20828 Contact with and (suspected) exposure to other viral communicable diseases: Secondary | ICD-10-CM

## 2018-12-01 LAB — NOVEL CORONAVIRUS, NAA: SARS-CoV-2, NAA: NOT DETECTED

## 2019-02-11 ENCOUNTER — Encounter: Payer: Self-pay | Admitting: Emergency Medicine

## 2019-02-11 ENCOUNTER — Emergency Department
Admission: EM | Admit: 2019-02-11 | Discharge: 2019-02-11 | Disposition: A | Discharge status: Home / Self Care | Payer: Self-pay | Attending: Emergency Medicine | Admitting: Emergency Medicine

## 2019-02-11 ENCOUNTER — Other Ambulatory Visit: Payer: Self-pay

## 2019-02-11 DIAGNOSIS — Z79899 Other long term (current) drug therapy: Secondary | ICD-10-CM | POA: Insufficient documentation

## 2019-02-11 DIAGNOSIS — M25532 Pain in left wrist: Secondary | ICD-10-CM | POA: Insufficient documentation

## 2019-02-11 DIAGNOSIS — F17210 Nicotine dependence, cigarettes, uncomplicated: Secondary | ICD-10-CM | POA: Insufficient documentation

## 2019-02-11 MED ORDER — KETOROLAC TROMETHAMINE 30 MG/ML IJ SOLN
30.0000 mg | Freq: Once | INTRAMUSCULAR | Status: AC
  Administered 2019-02-11: 30 mg via INTRAMUSCULAR
  Filled 2019-02-11: qty 1

## 2019-02-11 NOTE — ED Triage Notes (Signed)
L wrist pain, states woke up with it yesterday. Denies injury.

## 2019-02-11 NOTE — ED Provider Notes (Signed)
[facility] Emergency Department Provider Note  ____________________________________________  Time seen: Approximately 5:35 PM  I have reviewed the triage vital signs and the nursing notes.   HISTORY  Chief Complaint Wrist Pain    HPI Brian Saunders is a 33 y.o. male that presents to emergency department for evaluation of left wrist pain for 2 days.  Patient states that he woke up with his wrist sore yesterday morning.  Patient denies any trauma.  He is left-handed.  He is uses his hands and his wrist a lot at work.  No numbness, tingling.   Past Medical History:  Diagnosis Date  . Scoliosis   . Sickle cell trait (HCC)     There are no problems to display for this patient.   History reviewed. No pertinent surgical history.  Prior to Admission medications   Medication Sig Start Date End Date Taking? Authorizing Provider  meloxicam (MOBIC) 15 MG tablet Take 1 tablet (15 mg total) by mouth daily. 06/13/18   Cuthriell, Delorise Royals, PA-C  naproxen (NAPROSYN) 500 MG tablet Take 1 tablet (500 mg total) by mouth 2 (two) times daily with a meal. 04/26/18   Jene Every, MD  ondansetron (ZOFRAN-ODT) 4 MG disintegrating tablet Take 1 tablet (4 mg total) by mouth every 8 (eight) hours as needed. 05/09/18   Faythe Ghee, PA-C    Allergies Patient has no known allergies.  No family history on file.  Social History Social History   Tobacco Use  . Smoking status: Current Every Day Smoker    Packs/day: 0.50    Types: Cigarettes  . Smokeless tobacco: Never Used  Substance Use Topics  . Alcohol use: Yes    Alcohol/week: 9.0 standard drinks    Types: 9 Cans of beer per week  . Drug use: Not Currently     Review of Systems  Constitutional: No fever/chills Gastrointestinal: No nausea, no vomiting.  Musculoskeletal: Positive for wrist pain. Skin: Negative for rash, abrasions, lacerations, ecchymosis. Neurological: Negative for numbness or  tingling   ____________________________________________   PHYSICAL EXAM:  VITAL SIGNS: ED Triage Vitals  Enc Vitals Group     BP 02/11/19 1638 (!) 129/102     Pulse Rate 02/11/19 1638 89     Resp 02/11/19 1638 20     Temp 02/11/19 1638 97.9 F (36.6 C)     Temp Source 02/11/19 1638 Oral     SpO2 02/11/19 1638 100 %     Weight 02/11/19 1639 187 lb (84.8 kg)     Height 02/11/19 1639 5\' 6"  (1.676 m)     Head Circumference --      Peak Flow --      Pain Score 02/11/19 1639 8     Pain Loc --      Pain Edu? --      Excl. in GC? --      Constitutional: Alert and oriented. Well appearing and in no acute distress. Eyes: Conjunctivae are normal. PERRL. EOMI. Head: Atraumatic. ENT:      Ears:      Nose: No congestion/rhinnorhea.      Mouth/Throat: Mucous membranes are moist.  Neck: No stridor. Cardiovascular: Normal rate, regular rhythm.  Good peripheral circulation.  Symmetric radial pulses bilaterally. Respiratory: Normal respiratory effort without tachypnea or retractions. Lungs CTAB. Good air entry to the bases with no decreased or absent breath sounds. Musculoskeletal: Full range of motion to all extremities. No gross deformities appreciated.  Tenderness to palpation to mid  dorsal wrist without any overlying swelling or erythema.  Full range of motion of wrist with minimal pain.  Negative Finkelstein's test. Neurologic:  Normal speech and language. No gross focal neurologic deficits are appreciated.  Skin:  Skin is warm, dry and intact. No rash noted. Psychiatric: Mood and affect are normal. Speech and behavior are normal. Patient exhibits appropriate insight and judgement.   ____________________________________________   LABS (all labs ordered are listed, but only abnormal results are displayed)  Labs Reviewed - No data to display ____________________________________________  EKG   ____________________________________________  RADIOLOGY  No results  found.  ____________________________________________    PROCEDURES  Procedure(s) performed:    Procedures    Medications  ketorolac (TORADOL) 30 MG/ML injection 30 mg (30 mg Intramuscular Given 02/11/19 1746)     ____________________________________________   INITIAL IMPRESSION / ASSESSMENT AND PLAN / ED COURSE  Pertinent labs & imaging results that were available during my care of the patient were reviewed by me and considered in my medical decision making (see chart for details).  Review of the [place] CSRS was performed in accordance of the [place] prior to dispensing any controlled drugs.   Patient presented to emergency department for evaluation of wrist pain for 2 days.  Vital signs and exam are reassuring.  Imaging was offered and patient declines at this time.  Pain is likely inflammatory from frequent use.  Velcro wrist splint was placed.  IM Toradol was given for pain.  Patient will continue ibuprofen.  Patient is to follow up with primary care as directed. Patient is given ED precautions to return to the ED for any worsening or new symptoms.  Willmar Saunders was evaluated in Emergency Department on 02/11/2019 for the symptoms described in the history of present illness. He was evaluated in the context of the global COVID-19 pandemic, which necessitated consideration that the patient might be at risk for infection with the SARS-CoV-2 virus that causes COVID-19. Institutional protocols and algorithms that pertain to the evaluation of patients at risk for COVID-19 are in a state of rapid change based on information released by regulatory bodies including the CDC and federal and state organizations. These policies and algorithms were followed during the patient's care in the ED.    ____________________________________________  FINAL CLINICAL IMPRESSION(S) / ED DIAGNOSES  Final diagnoses:  Left wrist pain      NEW MEDICATIONS STARTED DURING THIS VISIT:  ED Discharge  Orders    None          This chart was dictated using voice recognition software/Dragon. Despite best efforts to proofread, errors can occur which can change the meaning. Any change was purely unintentional.    Laban Emperor, PA-C 02/11/19 1842    Carrie Mew, MD 02/11/19 [phone]

## 2019-02-11 NOTE — ED Notes (Signed)
Pt awoke with wrist pain Friday. 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

## 2019-02-24 DIAGNOSIS — U071 COVID-19: Secondary | ICD-10-CM

## 2019-02-24 HISTORY — DX: COVID-19: U07.1

## 2019-03-10 ENCOUNTER — Emergency Department: Payer: Self-pay

## 2019-03-10 ENCOUNTER — Emergency Department
Admission: EM | Admit: 2019-03-10 | Discharge: 2019-03-10 | Disposition: A | Discharge status: Home / Self Care | Payer: Self-pay | Attending: Emergency Medicine | Admitting: Emergency Medicine

## 2019-03-10 ENCOUNTER — Other Ambulatory Visit: Payer: Self-pay

## 2019-03-10 DIAGNOSIS — M25532 Pain in left wrist: Secondary | ICD-10-CM | POA: Insufficient documentation

## 2019-03-10 DIAGNOSIS — F17210 Nicotine dependence, cigarettes, uncomplicated: Secondary | ICD-10-CM | POA: Insufficient documentation

## 2019-03-10 MED ORDER — DEXAMETHASONE SODIUM PHOSPHATE 10 MG/ML IJ SOLN
10.0000 mg | Freq: Once | INTRAMUSCULAR | Status: AC
  Administered 2019-03-10: 10 mg via INTRAMUSCULAR
  Filled 2019-03-10: qty 1

## 2019-03-10 MED ORDER — MELOXICAM 15 MG PO TABS: 15.0000 mg | ORAL_TABLET | Freq: Every day | ORAL | 0 refills | Status: DC

## 2019-03-10 NOTE — ED Triage Notes (Signed)
Pt comes via POV from home with c/o left arm pain. Pt states he was seen here few weeks ago about his left wrist.   Pt states yesterday he went to pick up his daughter and felt like his arm locked up.

## 2019-03-10 NOTE — Discharge Instructions (Signed)
Follow-up with Dr. Allena Katz if any continued problems with your wrist.  A prescription for meloxicam was sent to your pharmacy.  You will need to pick this up and begin taking it on Saturday.  This tablet is once a day every day for the next 20 days.  Wear your wrist splint while you are up.  Do not have to wear it while you are sleeping.

## 2019-03-10 NOTE — ED Provider Notes (Signed)
[facility] Emergency Department Provider Note  ____________________________________________   First MD Initiated Contact with Patient 03/10/19 1200     (approximate)  I have reviewed the triage vital signs and the nursing notes.   HISTORY  Chief Complaint Arm Pain   HPI Brian Saunders is a 34 y.o. male presents to the ED with complaint of left wrist pain.  Patient states that he was seen here a few weeks ago for the same complaint.  He admits never got the meloxicam that he was discharged on but he does wear his cock-up wrist splint except for the day that he picked up his daughter.  Patient is left-hand dominant.  He states that his wrist was not x-rayed on his last visit.  Currently rates his pain as 9 out of 10.      Past Medical History:  Diagnosis Date  . Scoliosis   . Sickle cell trait ([place])     There are no problems to display for this patient.   History reviewed. No pertinent surgical history.  Prior to Admission medications   Medication Sig Start Date End Date Taking? Authorizing Provider  meloxicam (MOBIC) 15 MG tablet Take 1 tablet (15 mg total) by mouth daily. 03/10/19   Johnn Hai, PA-C    Allergies Patient has no known allergies.  No family history on file.  Social History Social History   Tobacco Use  . Smoking status: Current Every Day Smoker    Packs/day: 0.50    Types: Cigarettes  . Smokeless tobacco: Never Used  Substance Use Topics  . Alcohol use: Yes    Alcohol/week: 9.0 standard drinks    Types: 9 Cans of beer per week  . Drug use: Not Currently    Review of Systems Constitutional: No fever/chills Eyes: No visual changes. Cardiovascular: Denies chest pain. Respiratory: Denies shortness of breath. Musculoskeletal: Positive left wrist pain. Skin: Negative for rash. Neurological: Negative for headaches, focal weakness or numbness. ____________________________________________   PHYSICAL  EXAM:  VITAL SIGNS: ED Triage Vitals  Enc Vitals Group     BP 03/10/19 1129 (!) 133/91     Pulse Rate 03/10/19 1129 (!) 105     Resp 03/10/19 1129 17     Temp 03/10/19 1129 98.5 F (36.9 C)     Temp src --      SpO2 03/10/19 1129 99 %     Weight 03/10/19 1127 187 lb (84.8 kg)     Height 03/10/19 1127 5\' 6"  (1.676 m)     Head Circumference --      Peak Flow --      Pain Score 03/10/19 1127 9     Pain Loc --      Pain Edu? --      Excl. in [place]? --    Constitutional: Alert and oriented. Well appearing and in no acute distress. Eyes: Conjunctivae are normal.  Head: Atraumatic. Neck: No stridor.   Cardiovascular: Normal rate, regular rhythm. Grossly normal heart sounds.  Good peripheral circulation. Respiratory: Normal respiratory effort.  No retractions. Lungs CTAB. Musculoskeletal: Examination of left wrist there is no gross deformity and no soft tissue edema present.  Patient is slightly restricted on flexion extension as he states that it increases his pain.  No crepitus was appreciated.  Good muscle strength bilaterally.  Skin is intact and no discoloration noted. Neurologic:  Normal speech and language. No gross focal neurologic deficits are appreciated.  Skin:  Skin is warm, dry  and intact. No rash noted. Psychiatric: Mood and affect are normal. Speech and behavior are normal.  ____________________________________________   LABS (all labs ordered are listed, but only abnormal results are displayed)  Labs Reviewed - No data to display RADIOLOGY  ED MD interpretation:  No acute changes left wrist.  Official radiology report(s): DG Wrist Complete Left  Result Date: 03/10/2019 CLINICAL DATA:  Pain after lifting a child. EXAM: LEFT WRIST - COMPLETE 3+ VIEW COMPARISON:  None. FINDINGS: There is no evidence of fracture or dislocation. There is no evidence of arthropathy or other focal bone abnormality. Soft tissues are unremarkable. IMPRESSION: No acute osseous injury of the  left wrist. Electronically Signed   By: Elige Ko   On: 03/10/2019 12:40    ____________________________________________   PROCEDURES  Procedure(s) performed (including Critical Care):  Procedures   ____________________________________________   INITIAL IMPRESSION / ASSESSMENT AND PLAN / ED COURSE  As part of my medical decision making, I reviewed the following data within the electronic MEDICAL RECORD NUMBER Notes from prior ED visits and [place] Controlled Substance Database  34 year old male presents to the ED with complaint of left wrist pain.  Patient was seen on 02/11/2019 for the same complaint.  Patient states that he did not get the prescription filled but has worn his wrist brace except for the day he lifted his daughter.  He reports that he heard a pop and thought that he made things worse.  He has not taken any over-the-counter medication.  X-rays were negative for any acute changes.  Physical exam shows his wrist to be tender with flexion and extension with no gross deformity.  Patient is encouraged to wear his wrist splint and get the prescription for the meloxicam field.  He was given a Decadron injection prior to discharge.  He is to follow-up with the orthopedist on call and contact information is listed on his discharge papers.   FINAL CLINICAL IMPRESSION(S) / ED DIAGNOSES  Final diagnoses:  Left wrist pain     ED Discharge Orders         Ordered    meloxicam (MOBIC) 15 MG tablet  Daily     03/10/19 1248           Note:  This document was prepared using Dragon voice recognition software and may include unintentional dictation errors.    Tommi Rumps, PA-C 03/10/19 1300    Minna Antis, MD 03/10/19 1415

## 2019-07-29 IMAGING — DX LEFT KNEE - COMPLETE 4+ VIEW
4 series · 4 of 4 positions shown · non-contrast
Comparison: None.

CLINICAL DATA: Acute left knee pain without known injury.

EXAM:
LEFT KNEE - COMPLETE 4+ VIEW

[knee ap]
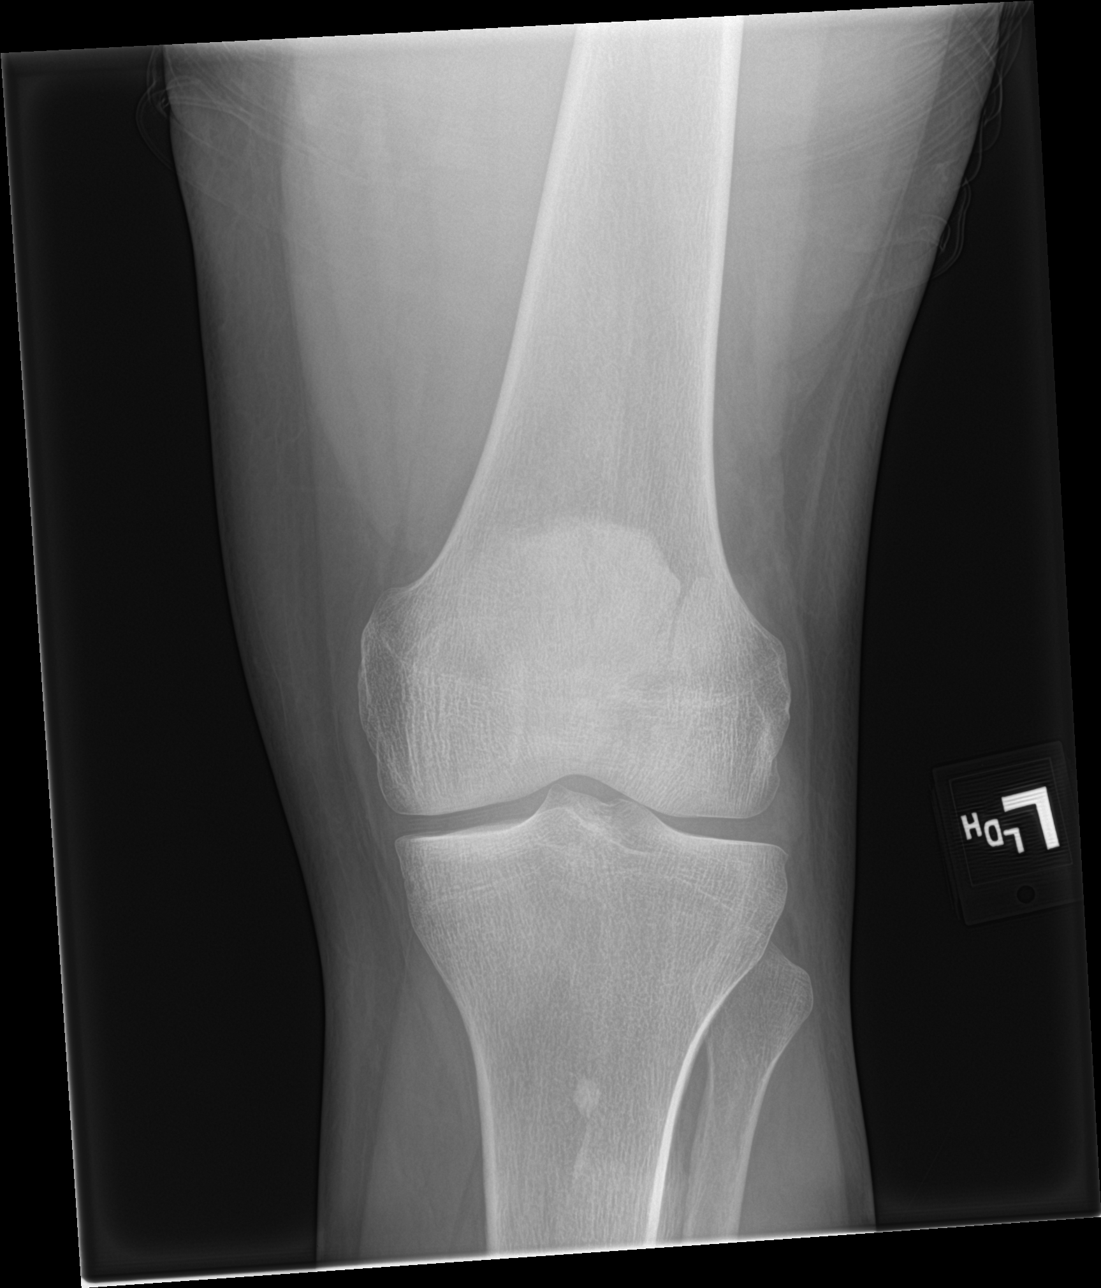

[knee tunnel]
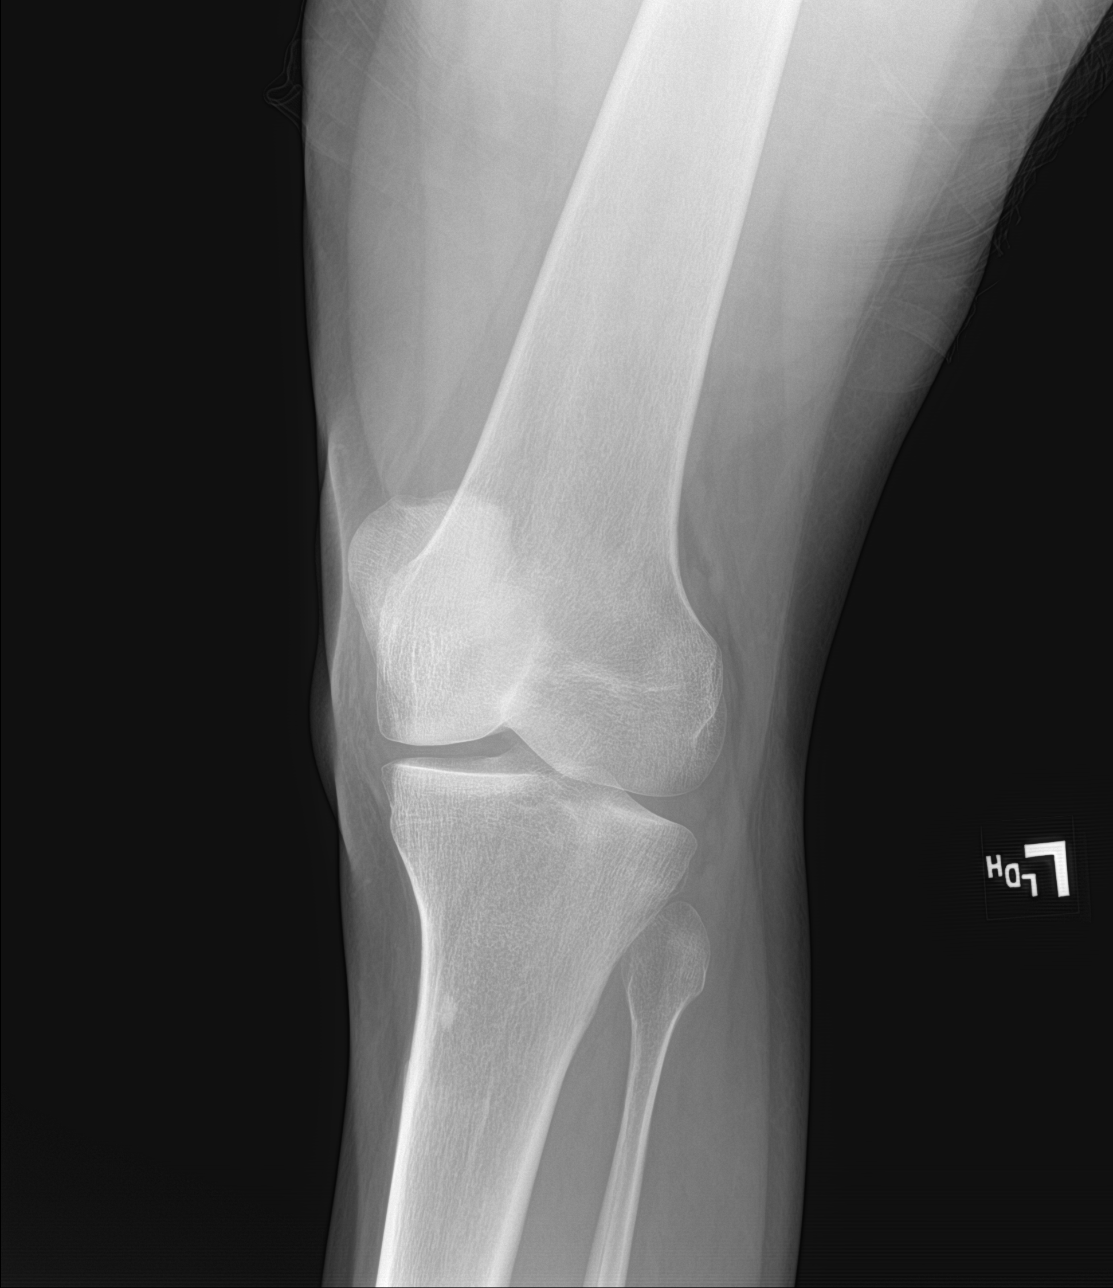

[knee lat]
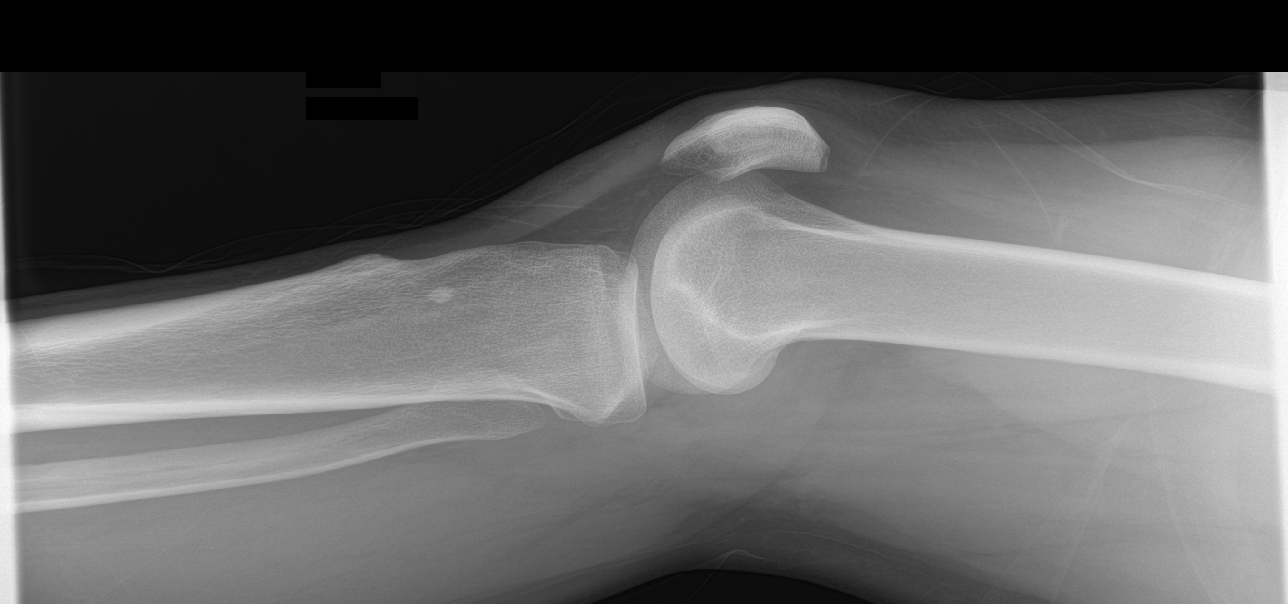

[knee obl]
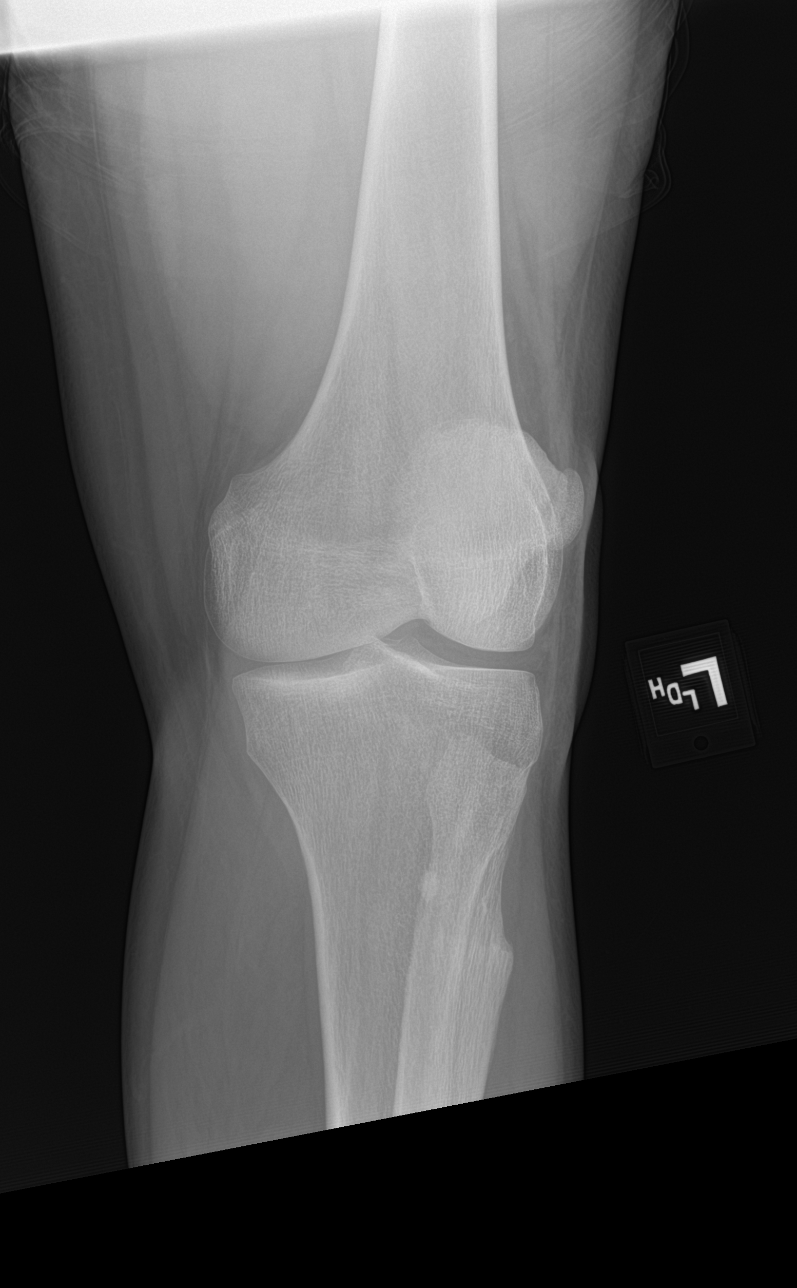

[4 of 4 positions shown; findings below may reference images not displayed]

FINDINGS: No evidence of fracture, dislocation, or joint effusion. No evidence
of arthropathy or other focal bone abnormality. Soft tissues are
unremarkable.
IMPRESSION: Negative.

## 2019-11-01 ENCOUNTER — Other Ambulatory Visit: Payer: Self-pay

## 2019-11-11 ENCOUNTER — Other Ambulatory Visit: Payer: Self-pay

## 2019-11-11 DIAGNOSIS — R109 Unspecified abdominal pain: Secondary | ICD-10-CM | POA: Insufficient documentation

## 2019-11-11 DIAGNOSIS — Z5321 Procedure and treatment not carried out due to patient leaving prior to being seen by health care provider: Secondary | ICD-10-CM | POA: Insufficient documentation

## 2019-11-11 LAB — CBC
HCT: 43 % (ref 39.0–52.0)
Hemoglobin: 15.1 g/dL (ref 13.0–17.0)
MCH: 30.8 pg (ref 26.0–34.0)
MCHC: 35.1 g/dL (ref 30.0–36.0)
MCV: 87.8 fL (ref 80.0–100.0)
Platelets: 299 10*3/uL (ref 150–400)
RBC: 4.9 MIL/uL (ref 4.22–5.81)
RDW: 13.4 % (ref 11.5–15.5)
WBC: 7.2 10*3/uL (ref 4.0–10.5)
nRBC: 0 % (ref 0.0–0.2)

## 2019-11-11 LAB — COMPREHENSIVE METABOLIC PANEL
ALT: 53 U/L — ABNORMAL HIGH (ref 0–44)
AST: 56 U/L — ABNORMAL HIGH (ref 15–41)
Albumin: 4.5 g/dL (ref 3.5–5.0)
Alkaline Phosphatase: 77 U/L (ref 38–126)
Anion gap: 11 (ref 5–15)
BUN: 11 mg/dL (ref 6–20)
CO2: 24 mmol/L (ref 22–32)
Calcium: 9.4 mg/dL (ref 8.9–10.3)
Chloride: 102 mmol/L (ref 98–111)
Creatinine, Ser: 1.01 mg/dL (ref 0.61–1.24)
GFR calc Af Amer: >60 mL/min (ref >60)
GFR calc non Af Amer: >60 mL/min (ref >60)
Glucose, Bld: 141 mg/dL — ABNORMAL HIGH (ref 70–99)
Potassium: 4 mmol/L (ref 3.5–5.1)
Sodium: 137 mmol/L (ref 135–145)
Total Bilirubin: 0.7 mg/dL (ref 0.3–1.2)
Total Protein: 8.3 g/dL — ABNORMAL HIGH (ref 6.5–8.1)

## 2019-11-11 LAB — LIPASE, BLOOD: Lipase: 32 U/L (ref 11–51)

## 2019-11-11 NOTE — ED Triage Notes (Signed)
Patient reports left sided abdominal pain off/on since Tuesday.

## 2019-11-12 ENCOUNTER — Other Ambulatory Visit: Payer: Self-pay

## 2019-11-12 ENCOUNTER — Emergency Department: Payer: Self-pay

## 2019-11-12 ENCOUNTER — Emergency Department
Admission: EM | Admit: 2019-11-12 | Discharge: 2019-11-12 | Disposition: A | Discharge status: Home / Self Care | Payer: Self-pay | Attending: Emergency Medicine | Admitting: Emergency Medicine

## 2019-11-12 ENCOUNTER — Encounter: Payer: Self-pay | Admitting: Emergency Medicine

## 2019-11-12 ENCOUNTER — Emergency Department
Admission: EM | Admit: 2019-11-12 | Discharge: 2019-11-13 | Discharge status: Against Medical Advice | Payer: Medicaid Other

## 2019-11-12 DIAGNOSIS — F17200 Nicotine dependence, unspecified, uncomplicated: Secondary | ICD-10-CM | POA: Insufficient documentation

## 2019-11-12 DIAGNOSIS — K2920 Alcoholic gastritis without bleeding: Secondary | ICD-10-CM

## 2019-11-12 HISTORY — DX: Alcohol abuse, uncomplicated: F10.10

## 2019-11-12 LAB — CBC
HCT: 43.7 % (ref 39.0–52.0)
Hemoglobin: 15.3 g/dL (ref 13.0–17.0)
MCH: 31.2 pg (ref 26.0–34.0)
MCHC: 35 g/dL (ref 30.0–36.0)
MCV: 89.2 fL (ref 80.0–100.0)
Platelets: 290 10*3/uL (ref 150–400)
RBC: 4.9 MIL/uL (ref 4.22–5.81)
RDW: 13.5 % (ref 11.5–15.5)
WBC: 6 10*3/uL (ref 4.0–10.5)
nRBC: 0 % (ref 0.0–0.2)

## 2019-11-12 LAB — COMPREHENSIVE METABOLIC PANEL
ALT: 59 U/L — ABNORMAL HIGH (ref 0–44)
AST: 63 U/L — ABNORMAL HIGH (ref 15–41)
Albumin: 4.1 g/dL (ref 3.5–5.0)
Alkaline Phosphatase: 72 U/L (ref 38–126)
Anion gap: 13 (ref 5–15)
BUN: 9 mg/dL (ref 6–20)
CO2: 22 mmol/L (ref 22–32)
Calcium: 9.2 mg/dL (ref 8.9–10.3)
Chloride: 100 mmol/L (ref 98–111)
Creatinine, Ser: 1.08 mg/dL (ref 0.61–1.24)
GFR calc Af Amer: >60 mL/min (ref >60)
GFR calc non Af Amer: >60 mL/min (ref >60)
Glucose, Bld: 145 mg/dL — ABNORMAL HIGH (ref 70–99)
Potassium: 4 mmol/L (ref 3.5–5.1)
Sodium: 135 mmol/L (ref 135–145)
Total Bilirubin: 0.9 mg/dL (ref 0.3–1.2)
Total Protein: 7.7 g/dL (ref 6.5–8.1)

## 2019-11-12 LAB — URINALYSIS, COMPLETE (UACMP) WITH MICROSCOPIC
Bacteria, UA: NONE SEEN
Bilirubin Urine: NEGATIVE
Glucose, UA: NEGATIVE mg/dL
Hgb urine dipstick: NEGATIVE
Ketones, ur: NEGATIVE mg/dL
Leukocytes,Ua: NEGATIVE
Nitrite: NEGATIVE
Protein, ur: NEGATIVE mg/dL
Specific Gravity, Urine: 1.021 (ref 1.005–1.030)
pH: 5 (ref 5.0–8.0)

## 2019-11-12 LAB — LIPASE, BLOOD: Lipase: 26 U/L (ref 11–51)

## 2019-11-12 MED ORDER — ALUM & MAG HYDROXIDE-SIMETH 200-200-20 MG/5ML PO SUSP
30.0000 mL | Freq: Once | ORAL | Status: AC
  Administered 2019-11-12: 30 mL via ORAL
  Filled 2019-11-12: qty 30

## 2019-11-12 MED ORDER — PANTOPRAZOLE SODIUM 40 MG PO TBEC
40.0000 mg | DELAYED_RELEASE_TABLET | Freq: Every day | ORAL | Status: DC
  Administered 2019-11-12: 40 mg via ORAL
  Filled 2019-11-12: qty 1

## 2019-11-12 MED ORDER — LIDOCAINE VISCOUS HCL 2 % MT SOLN
15.0000 mL | Freq: Once | OROMUCOSAL | Status: AC
  Administered 2019-11-12: 15 mL via ORAL
  Filled 2019-11-12: qty 15

## 2019-11-12 MED ORDER — IOHEXOL 300 MG/ML  SOLN
100.0000 mL | Freq: Once | INTRAMUSCULAR | Status: AC | PRN
  Administered 2019-11-12: 100 mL via INTRAVENOUS
  Filled 2019-11-12: qty 100

## 2019-11-12 MED ORDER — OMEPRAZOLE 10 MG PO CPDR: 10.0000 mg | DELAYED_RELEASE_CAPSULE | Freq: Every day | ORAL | 1 refills | Status: DC

## 2019-11-12 NOTE — ED Notes (Signed)
Pt states pain in abdomen began about 4d ago. Denies injury. Pain is 8/10 when coughs, 6/10 when not coughing. Pain is located L side under ribs. Not radiating.

## 2019-11-12 NOTE — ED Triage Notes (Signed)
Pt here for LUQ pain. Was here last night for same and had labs done but left before seen.  Pain is only if coughs or sneezes but denies cough.  No pain currently. Denies NVD or fever.  Pt drinks all day from noon till he goes to bed but reports he did cut back to 2% alcohol 3 weeks ago. Last drink was early this AM.

## 2019-11-12 NOTE — ED Provider Notes (Signed)
[facility] Emergency Department Provider Note  ____________________________________________  Time seen: Approximately 5:03 PM  I have reviewed the triage vital signs and the nursing notes.   HISTORY  Chief Complaint Abdominal Pain    HPI Brian Saunders is a 34 y.o. male who presents the emergency department complaining of epigastric abdominal pain.  Patient states that he has had symptoms for a week or 2.  He was in the emergency department last night with same complaint of epigastric pain but left due to the wait time.  Patient has had no emesis, diarrhea or constipation.  No history of issues with his gallbladder or pancreas.  No history of stomach ulcers.  Patient states that he is a heavy drinker, he states that he is trying to decrease his alcohol consumption.  Patient states that he started drinking more mild alcoholic beverages, and is now trying to decrease the number of beverages a day he is drinking.  Patient has no history of cirrhosis.  He denies any sore throat or difficulty swallowing.  No medications for his complaint prior to arrival.         Past Medical History:  Diagnosis Date  . ETOH abuse     There are no problems to display for this patient.   History reviewed. No pertinent surgical history.  Prior to Admission medications   Medication Sig Start Date End Date Taking? Authorizing Provider  omeprazole (PRILOSEC) 10 MG capsule Take 1 capsule (10 mg total) by mouth daily. 11/12/19   Vayda Dungee, Delorise Royals, PA-C    Allergies Patient has no known allergies.  History reviewed. No pertinent family history.  Social History Social History   Tobacco Use  . Smoking status: Current Every Day Smoker  . Smokeless tobacco: Never Used  Substance Use Topics  . Alcohol use: Yes  . Drug use: Never     Review of Systems  Constitutional: No fever/chills Eyes: No visual changes. No discharge ENT: No upper respiratory  complaints. Cardiovascular: no chest pain. Respiratory: no cough. No SOB. Gastrointestinal: Positive for epigastric abdominal pain.  No nausea, no vomiting.  No diarrhea.  No constipation. Musculoskeletal: Negative for musculoskeletal pain. Skin: Negative for rash, abrasions, lacerations, ecchymosis. Neurological: Negative for headaches, focal weakness or numbness. 10-point ROS otherwise negative.  ____________________________________________   PHYSICAL EXAM:  VITAL SIGNS: ED Triage Vitals [11/12/19 1519]  Enc Vitals Group     BP (!) 149/109     Pulse Rate 91     Resp 16     Temp 98.6 F (37 C)     Temp Source Oral     SpO2 98 %     Weight 198 lb (89.8 kg)     Height 5\' 6"  (1.676 m)     Head Circumference      Peak Flow      Pain Score 0     Pain Loc      Pain Edu?      Excl. in GC?      Constitutional: Alert and oriented. Well appearing and in no acute distress. Eyes: Conjunctivae are normal. PERRL. EOMI. Head: Atraumatic. ENT:      Ears:       Nose: No congestion/rhinnorhea.      Mouth/Throat: Mucous membranes are moist.  Neck: No stridor.    Cardiovascular: Normal rate, regular rhythm. Normal S1 and S2.  Good peripheral circulation. Respiratory: Normal respiratory effort without tachypnea or retractions. Lungs CTAB. Good air entry to the bases with no  decreased or absent breath sounds. Gastrointestinal: No visible external abdominal wall findings.  Bowel sounds 4 quadrants.  Soft to palpation all quadrants.  Patient is mildly tender to palpation in the epigastric region extending into the left upper quadrant.  No palpable abnormalities.  No guarding or rigidity. No palpable masses. No distention. No CVA tenderness. Musculoskeletal: Full range of motion to all extremities. No gross deformities appreciated. Neurologic:  Normal speech and language. No gross focal neurologic deficits are appreciated.  Skin:  Skin is warm, dry and intact. No rash noted. Psychiatric:  Mood and affect are normal. Speech and behavior are normal. Patient exhibits appropriate insight and judgement.   ____________________________________________   LABS (all labs ordered are listed, but only abnormal results are displayed)  Labs Reviewed  COMPREHENSIVE METABOLIC PANEL - Abnormal; Notable for the following components:      Result Value   Glucose, Bld 145 (*)    AST 63 (*)    ALT 59 (*)    All other components within normal limits  URINALYSIS, COMPLETE (UACMP) WITH MICROSCOPIC - Abnormal; Notable for the following components:   Color, Urine YELLOW (*)    APPearance CLEAR (*)    All other components within normal limits  LIPASE, BLOOD  CBC   ____________________________________________  EKG   ____________________________________________  RADIOLOGY I personally viewed and evaluated these images as part of my medical decision making, as well as reviewing the written report by the radiologist.  CT ABDOMEN PELVIS W CONTRAST  Result Date: 11/12/2019 CLINICAL DATA:  Left upper quadrant epigastric pain EXAM: CT ABDOMEN AND PELVIS WITH CONTRAST TECHNIQUE: Multidetector CT imaging of the abdomen and pelvis was performed using the standard protocol following bolus administration of intravenous contrast. CONTRAST:  OMNIPAQUE IOHEXOL 300 MG/ML  SOLN COMPARISON:  None. FINDINGS: Lower chest: The visualized heart size within normal limits. No pericardial fluid/thickening. No hiatal hernia. The visualized portions of the lungs are clear. Hepatobiliary: There is diffuse low density seen throughout the liver parenchyma. The main portal vein is patent. No evidence of calcified gallstones, gallbladder wall thickening or biliary dilatation. Pancreas: Unremarkable. No pancreatic ductal dilatation or surrounding inflammatory changes. Spleen: Normal in size without focal abnormality. Adrenals/Urinary Tract: Both adrenal glands appear normal. The kidneys and collecting system appear normal  without evidence of urinary tract calculus or hydronephrosis. Bladder is unremarkable. Stomach/Bowel: The stomach, small bowel, and colon are normal in appearance. No inflammatory changes, wall thickening, or obstructive findings. Scattered colonic diverticula are noted. The appendix is normal. Vascular/Lymphatic: There are no enlarged mesenteric, retroperitoneal, or pelvic lymph nodes. No significant vascular findings are present. Reproductive: The prostate is unremarkable. Other: No evidence of abdominal wall mass. A small fat containing anterior umbilical hernia is noted. Musculoskeletal: No acute or significant osseous findings. IMPRESSION: No acute intra-abdominal or pelvic pathology to explain the patient's symptoms. Hepatic steatosis Diverticulosis without diverticulitis. Electronically Signed   By: Jonna Clark M.D.   On: 11/12/2019 18:57    ____________________________________________    PROCEDURES  Procedure(s) performed:    Procedures    Medications  alum & mag hydroxide-simeth (MAALOX/MYLANTA) 200-200-20 MG/5ML suspension 30 mL (has no administration in time range)    And  lidocaine (XYLOCAINE) 2 % viscous mouth solution 15 mL (has no administration in time range)  pantoprazole (PROTONIX) EC tablet 40 mg (has no administration in time range)  iohexol (OMNIPAQUE) 300 MG/ML solution 100 mL (100 mLs Intravenous Contrast Given 11/12/19 1829)     ____________________________________________   INITIAL IMPRESSION /  ASSESSMENT AND PLAN / ED COURSE  Pertinent labs & imaging results that were available during my care of the patient were reviewed by me and considered in my medical decision making (see chart for details).  Review of the [place] CSRS was performed in accordance of the [facility] prior to dispensing any controlled drugs.           Patient's diagnosis is consistent with gastritis.  Patient presents emergency department complaining of epigastric abdominal pain.  This is  intermittent in nature.  Patient did have some mild tenderness in this region with no palpable abnormalities.  No emesis.  No diarrhea or constipation.  Patient has seen no blood in his stools.  No history of longstanding GI complaints.  Patient is making attempts at reducing his alcohol consumption.  Feel that her gastritis is likely secondary to his alcohol consumption.  Labs and imaging were reassuring here in the emergency department.  Patient will be given PPI and GI cocktail here in the emergency department.  Patient will be prescribed omeprazole at home.  Follow-up primary care as needed.  Patient is given ED precautions to return to the ED for any worsening or new symptoms.     ____________________________________________  FINAL CLINICAL IMPRESSION(S) / ED DIAGNOSES  Final diagnoses:  Acute alcoholic gastritis without hemorrhage      NEW MEDICATIONS STARTED DURING THIS VISIT:  ED Discharge Orders         Ordered    omeprazole (PRILOSEC) 10 MG capsule  Daily        11/12/19 1937              This chart was dictated using voice recognition software/Dragon. Despite best efforts to proofread, errors can occur which can change the meaning. Any change was purely unintentional.    Racheal Patches, PA-C 11/12/19 1940    Shaune Pollack, MD 11/16/19 [phone]

## 2019-11-12 NOTE — ED Notes (Signed)
Pt to CT

## 2020-02-18 ENCOUNTER — Other Ambulatory Visit: Payer: Self-pay

## 2020-02-18 ENCOUNTER — Encounter: Payer: Self-pay | Admitting: Emergency Medicine

## 2020-02-18 ENCOUNTER — Emergency Department: Payer: HRSA Program

## 2020-02-18 ENCOUNTER — Emergency Department
Admission: EM | Admit: 2020-02-18 | Discharge: 2020-02-18 | Disposition: A | Discharge status: Home / Self Care | Payer: HRSA Program | Attending: Emergency Medicine | Admitting: Emergency Medicine

## 2020-02-18 DIAGNOSIS — F17210 Nicotine dependence, cigarettes, uncomplicated: Secondary | ICD-10-CM | POA: Insufficient documentation

## 2020-02-18 DIAGNOSIS — R059 Cough, unspecified: Secondary | ICD-10-CM

## 2020-02-18 DIAGNOSIS — U071 COVID-19: Secondary | ICD-10-CM | POA: Diagnosis not present

## 2020-02-18 DIAGNOSIS — J069 Acute upper respiratory infection, unspecified: Secondary | ICD-10-CM

## 2020-02-18 DIAGNOSIS — R509 Fever, unspecified: Secondary | ICD-10-CM

## 2020-02-18 LAB — CBC WITH DIFFERENTIAL/PLATELET
Abs Immature Granulocytes: 0.1 10*3/uL — ABNORMAL HIGH (ref 0.00–0.07)
Basophils Absolute: 0.1 10*3/uL (ref 0.0–0.1)
Basophils Relative: 1 %
Eosinophils Absolute: 0.2 10*3/uL (ref 0.0–0.5)
Eosinophils Relative: 2 %
HCT: 46.6 % (ref 39.0–52.0)
Hemoglobin: 16.5 g/dL (ref 13.0–17.0)
Immature Granulocytes: 1 %
Lymphocytes Relative: 6 %
Lymphs Abs: 0.6 10*3/uL — ABNORMAL LOW (ref 0.7–4.0)
MCH: 31.1 pg (ref 26.0–34.0)
MCHC: 35.4 g/dL (ref 30.0–36.0)
MCV: 87.8 fL (ref 80.0–100.0)
Monocytes Absolute: 0.7 10*3/uL (ref 0.1–1.0)
Monocytes Relative: 6 %
Neutro Abs: 8.8 10*3/uL — ABNORMAL HIGH (ref 1.7–7.7)
Neutrophils Relative %: 84 %
Platelets: 305 10*3/uL (ref 150–400)
RBC: 5.31 MIL/uL (ref 4.22–5.81)
RDW: 13 % (ref 11.5–15.5)
WBC: 10.4 10*3/uL (ref 4.0–10.5)
nRBC: 0 % (ref 0.0–0.2)

## 2020-02-18 LAB — COMPREHENSIVE METABOLIC PANEL
ALT: 38 U/L (ref 0–44)
AST: 36 U/L (ref 15–41)
Albumin: 4.5 g/dL (ref 3.5–5.0)
Alkaline Phosphatase: 92 U/L (ref 38–126)
Anion gap: 10 (ref 5–15)
BUN: 11 mg/dL (ref 6–20)
CO2: 21 mmol/L — ABNORMAL LOW (ref 22–32)
Calcium: 9.2 mg/dL (ref 8.9–10.3)
Chloride: 102 mmol/L (ref 98–111)
Creatinine, Ser: 1.11 mg/dL (ref 0.61–1.24)
GFR, Estimated: >60 mL/min (ref >60)
Glucose, Bld: 119 mg/dL — ABNORMAL HIGH (ref 70–99)
Potassium: 3.9 mmol/L (ref 3.5–5.1)
Sodium: 133 mmol/L — ABNORMAL LOW (ref 135–145)
Total Bilirubin: 0.8 mg/dL (ref 0.3–1.2)
Total Protein: 8.3 g/dL — ABNORMAL HIGH (ref 6.5–8.1)

## 2020-02-18 LAB — LACTIC ACID, PLASMA
Lactic Acid, Venous: 1.4 mmol/L (ref 0.5–1.9)
Lactic Acid, Venous: 1.1 mmol/L (ref 0.5–1.9)

## 2020-02-18 LAB — RESP PANEL BY RT-PCR (FLU A&B, COVID) ARPGX2
Influenza A by PCR: NEGATIVE
Influenza B by PCR: NEGATIVE
SARS Coronavirus 2 by RT PCR: POSITIVE — AB

## 2020-02-18 MED ORDER — SODIUM CHLORIDE 0.9 % IV BOLUS
1000.0000 mL | Freq: Once | INTRAVENOUS | Status: AC
  Administered 2020-02-18: 1000 mL via INTRAVENOUS

## 2020-02-18 MED ORDER — SODIUM CHLORIDE 0.9 % IV BOLUS
1000.0000 mL | Freq: Once | INTRAVENOUS | Status: AC
  Administered 2020-02-18: 21:00:00 1000 mL via INTRAVENOUS

## 2020-02-18 MED ORDER — ACETAMINOPHEN 500 MG PO TABS
1000.0000 mg | ORAL_TABLET | Freq: Once | ORAL | Status: AC
  Administered 2020-02-18: 1000 mg via ORAL
  Filled 2020-02-18: qty 2

## 2020-02-18 NOTE — ED Notes (Signed)
Patient c/o of body aches, chills, fever starting today. Denies being exposed to COVID that he knows of.

## 2020-02-18 NOTE — ED Notes (Signed)
Date and time results received: 02/18/20 2102 (use smartphrase ".now" to insert current time)  Test: COVID Critical Value: positive  Name of Provider Notified: Vicente Males, MD  Orders Received? Or Actions Taken?: Orders Received - See Orders for details

## 2020-02-18 NOTE — ED Provider Notes (Signed)
[facility] Emergency Department Provider Note ____________________________________________   Event Date/Time   First MD Initiated Contact with Patient 02/18/20 1945     (approximate)  I have reviewed the triage vital signs and the nursing notes.   HISTORY  Chief Complaint Fever, Cough, and Generalized Body Aches  HPI Kadir Azucena is a 34 y.o. male with history of ETHO abuse and sickle cell trait presents to the emergency department for treatment and evaluation of fever, headache, chills, cough, body aches, and nausea that started at around 8:45 this morning. He has tried to rest without improvement.         Past Medical History:  Diagnosis Date  . ETOH abuse   . Scoliosis   . Sickle cell trait (HCC)     There are no problems to display for this patient.   History reviewed. No pertinent surgical history.  Prior to Admission medications   Medication Sig Start Date End Date Taking? Authorizing Provider  meloxicam (MOBIC) 15 MG tablet Take 1 tablet (15 mg total) by mouth daily. 03/10/19   Tommi Rumps, PA-C  omeprazole (PRILOSEC) 10 MG capsule Take 1 capsule (10 mg total) by mouth daily. 11/12/19   Cuthriell, Delorise Royals, PA-C    Allergies Patient has no known allergies.  No family history on file.  Social History Social History   Tobacco Use  . Smoking status: Current Every Day Smoker    Packs/day: 0.50    Types: Cigarettes  . Smokeless tobacco: Never Used  Substance Use Topics  . Alcohol use: Yes  . Drug use: Never    Review of Systems  Constitutional: Positive for fever/chills Eyes: No visual changes. ENT: Positive for sore throat. Cardiovascular: Denies chest pain. Respiratory: Denies shortness of breath. Positive for cough. Gastrointestinal: No abdominal pain.  No nausea, no vomiting.  No diarrhea.  No constipation. Genitourinary: Negative for dysuria. Musculoskeletal: Negative for back pain. Skin: Negative for  rash. Neurological: Positive for headaches, focal weakness or numbness. ____________________________________________   PHYSICAL EXAM:  VITAL SIGNS: ED Triage Vitals  Enc Vitals Group     BP 02/18/20 1911 (!) 97/50     Pulse Rate 02/18/20 1911 (!) 150     Resp 02/18/20 1911 20     Temp 02/18/20 1911 (!) 103.2 F (39.6 C)     Temp Source 02/18/20 1911 Oral     SpO2 02/18/20 1911 94 %     Weight 02/18/20 1859 196 lb (88.9 kg)     Height 02/18/20 1859 5\' 4"  (1.626 m)     Head Circumference --      Peak Flow --      Pain Score --      Pain Loc --      Pain Edu? --      Excl. in GC? --     Constitutional: Alert and oriented. Ill appearing and in no acute distress. Eyes: Conjunctivae are normal. Head: Atraumatic. Nose: No congestion/rhinnorhea. Mouth/Throat: Mucous membranes are moist.  Oropharynx non-erythematous. Neck: No stridor.   Hematological/Lymphatic/Immunilogical: No cervical lymphadenopathy. Cardiovascular: Tachycardia. Grossly normal heart sounds.  Good peripheral circulation. Respiratory: Normal respiratory effort.  No retractions. Lungs CTAB. Gastrointestinal: Soft and nontender. No distention. No abdominal bruits. Genitourinary:  Musculoskeletal: No lower extremity tenderness nor edema.  No joint effusions. Neurologic:  Normal speech and language. No gross focal neurologic deficits are appreciated. No gait instability. Skin:  Skin is warm, dry and intact. No rash noted. Psychiatric: Mood and affect  are normal. Speech and behavior are normal.  ____________________________________________   LABS (all labs ordered are listed, but only abnormal results are displayed)  Labs Reviewed  RESP PANEL BY RT-PCR (FLU A&B, COVID) ARPGX2 - Abnormal; Notable for the following components:      Result Value   SARS Coronavirus 2 by RT PCR POSITIVE (*)    All other components within normal limits  COMPREHENSIVE METABOLIC PANEL - Abnormal; Notable for the following components:    Sodium 133 (*)    CO2 21 (*)    Glucose, Bld 119 (*)    Total Protein 8.3 (*)    All other components within normal limits  CBC WITH DIFFERENTIAL/PLATELET - Abnormal; Notable for the following components:   Neutro Abs 8.8 (*)    Lymphs Abs 0.6 (*)    Abs Immature Granulocytes 0.10 (*)    All other components within normal limits  LACTIC ACID, PLASMA  LACTIC ACID, PLASMA  URINALYSIS, COMPLETE (UACMP) WITH MICROSCOPIC   ____________________________________________  EKG  ED ECG REPORT I, Omarri Eich, FNP-BC personally viewed and interpreted this ECG.   Date: 02/18/2020  EKG Time: 1924  Rate: 147  Rhythm: sinus tachycardia  Axis: normal  Intervals:none  ST&T Change: no ST elevation  ____________________________________________  RADIOLOGY  ED MD interpretation:    No cardiopulmonary abnormality per radiology.  I, Kem Boroughs, personally viewed and evaluated these images (plain radiographs) as part of my medical decision making, as well as reviewing the written report by the radiologist.  Official radiology report(s): DG Chest 2 View  Result Date: 02/18/2020 CLINICAL DATA:  34 year old male with fever. EXAM: CHEST - 2 VIEW COMPARISON:  Chest radiograph dated 10/17/2018. FINDINGS: No focal consolidation, pleural effusion, pneumothorax. The cardiac silhouette is within limits. No acute osseous pathology. Scoliosis. IMPRESSION: No active cardiopulmonary disease. Electronically Signed   By: Elgie Collard M.D.   On: 02/18/2020 19:54    ____________________________________________   PROCEDURES  Procedure(s) performed (including Critical Care):  Procedures  ____________________________________________   INITIAL IMPRESSION / ASSESSMENT AND PLAN     34 year old male presenting to the emergency department for treatment and evaluation of symptoms as described in HPI. Vital signs show hypotension and tachycardia with fever. No hypoxia at this time on room air.  Febrile at 103.2. Tylenol given in triage.  DIFFERENTIAL DIAGNOSIS  COVID, influenza, sepsis  ED COURSE    Clinical Course as of 02/18/20 2127  Sun Feb 18, 2020  2004 Blood pressure is responding well to fluids. Now 125/77 with heart rate of 132.  [CT]  2005 Care transferred to Dr. Vicente Males.  Patient is COVID positive. Vital signs improving. 105/66; 110 HR; respiratory rate is 23 and temp is down to 99.8. [CT]    Clinical Course User Index [CT] Irene Mitcham, Kasandra Knudsen, FNP   ___________________________________________   FINAL CLINICAL IMPRESSION(S) / ED DIAGNOSES  Final diagnoses:  COVID     ED Discharge Orders    None       Tadan Shill was evaluated in Emergency Department on 02/18/2020 for the symptoms described in the history of present illness. He was evaluated in the context of the global COVID-19 pandemic, which necessitated consideration that the patient might be at risk for infection with the SARS-CoV-2 virus that causes COVID-19. Institutional protocols and algorithms that pertain to the evaluation of patients at risk for COVID-19 are in a state of rapid change based on information released by regulatory bodies including the CDC and federal and state  organizations. These policies and algorithms were followed during the patient's care in the ED.   Note:  This document was prepared using Dragon voice recognition software and may include unintentional dictation errors.   Chinita Pester, FNP 02/18/20 2127    Merwyn Katos, MD 02/18/20 [phone]

## 2020-02-18 NOTE — ED Triage Notes (Signed)
Pt to ED via POV c/o fever, chills, cough, generalized body aches, and nausea. Pt has had symptoms x 2 days. Pt denies positive COVID contacts. Pt is in NAD.

## 2020-02-18 NOTE — ED Notes (Signed)
Patient transported to X-ray 

## 2020-02-19 ENCOUNTER — Other Ambulatory Visit (HOSPITAL_COMMUNITY): Payer: Self-pay | Admitting: Nurse Practitioner

## 2020-02-19 ENCOUNTER — Encounter: Payer: Self-pay | Admitting: Nurse Practitioner

## 2020-02-19 DIAGNOSIS — U071 COVID-19: Secondary | ICD-10-CM

## 2020-02-19 NOTE — Progress Notes (Signed)
I connected by phone with Brian Saunders on 02/19/2020 at 11:53 AM to discuss the potential use of a treatment for mild to moderate COVID-19 viral infection in non-hospitalized patients.  This patient is a 34 y.o. male that meets the FDA criteria for Emergency Use Authorization of bamlanivimab/etesevimab, casirivimab\imdevimab, or sotrovimab  Has a (+) direct SARS-CoV-2 viral test result  Has mild or moderate COVID-19   Is ? 33 years of age and weighs ? 40 kg  Is NOT hospitalized due to COVID-19  Is NOT requiring oxygen therapy or requiring an increase in baseline oxygen flow rate due to COVID-19  Is within 10 days of symptom onset  Has at least one of the high risk factor(s) for progression to severe COVID-19 and/or hospitalization as defined in EUA.  Specific high risk criteria : Other high risk medical condition per CDC:  SVI 4   Onset 12/25. Unvaccinated.    I have spoken and communicated the following to the patient or parent/caregiver:  1. FDA has authorized the emergency use of bamlanivimab/etesevimab, casirivimab\imdevimab, or sotrovimab for the treatment of mild to moderate COVID-19 in adults and pediatric patients with positive results of direct SARS-CoV-2 viral testing who are 59 years of age and older weighing at least 40 kg, and who are at high risk for progressing to severe COVID-19 and/or hospitalization.  2. The significant known and potential risks and benefits of bamlanivimab/etesevimab, casirivimab\imdevimab, or sotrovimab, and the extent to which such potential risks and benefits are unknown.  3. Information on available alternative treatments and the risks and benefits of those alternatives, including clinical trials.  4. Patients treated with bamlanivimab/etesevimab, casirivimab\imdevimab, or sotrovimab should continue to self-isolate and use infection control measures (e.g., wear mask, isolate, social distance, avoid sharing personal items, clean and  disinfect "high touch" surfaces, and frequent handwashing) according to CDC guidelines.   5. The patient or parent/caregiver has the option to accept or refuse bamlanivimab/etesevimab, casirivimab\imdevimab, or sotrovimab.  After reviewing this information with the patient, the patient has agreed to receive one of the available covid 19 monoclonal antibodies and will be provided an appropriate fact sheet prior to infusion.[name], DNP, AGNP-C [phone] (Infusion Center Hotline)

## 2020-02-20 ENCOUNTER — Ambulatory Visit (HOSPITAL_COMMUNITY): Admission: RE | Admit: 2020-02-20 | Payer: Medicaid Other | Source: Ambulatory Visit

## 2020-04-24 IMAGING — CR DG WRIST COMPLETE 3+V*L*
1 series · 4 of 4 positions shown · non-contrast
Comparison: None.

CLINICAL DATA: Pain after lifting a child.

EXAM:
LEFT WRIST - COMPLETE 3+ VIEW

[Series 1: x wrist pa left · 0.14mm/px · 4 of 4 slices shown]
[im 1/4]
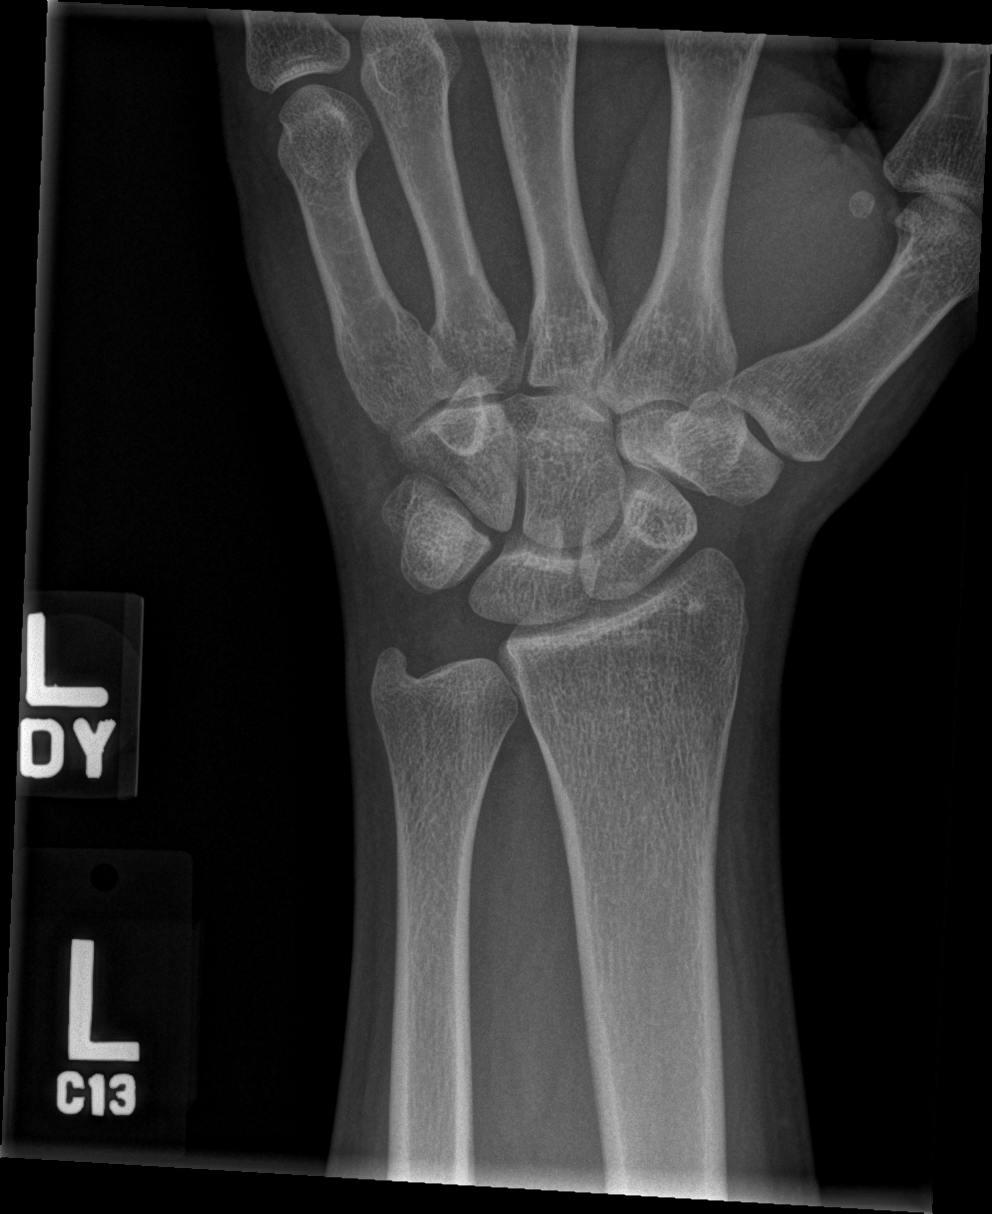
[im 2/4]
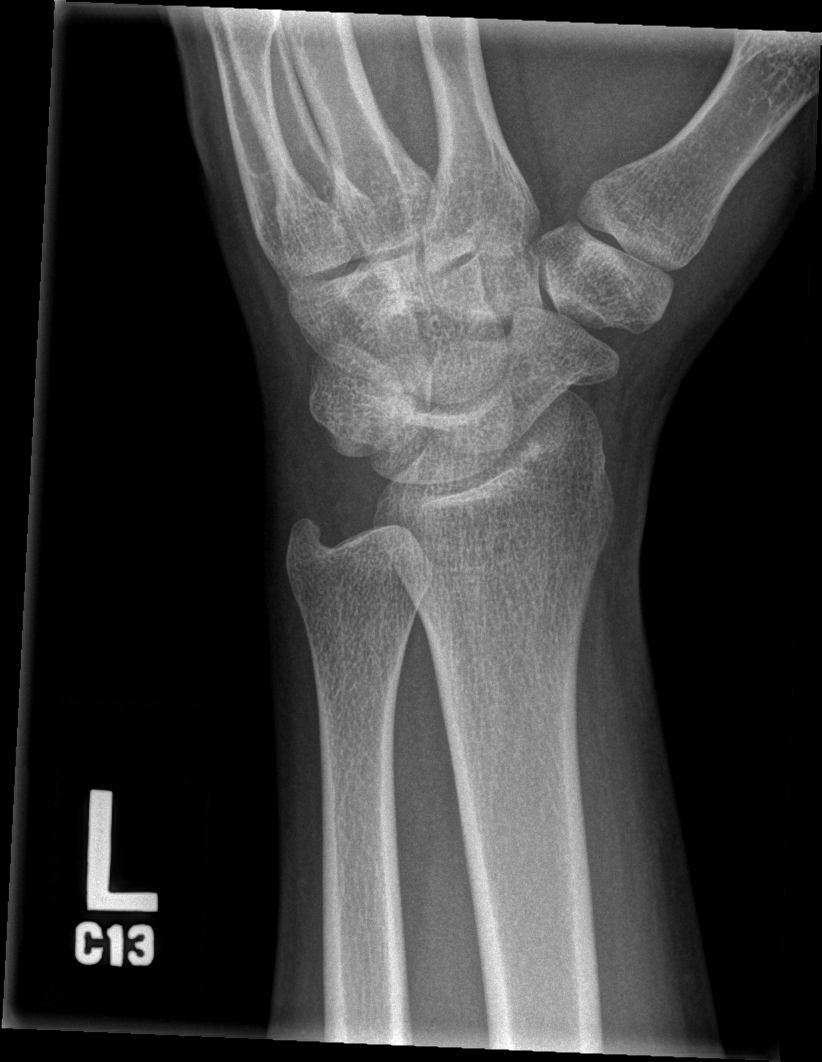
[im 3/4]
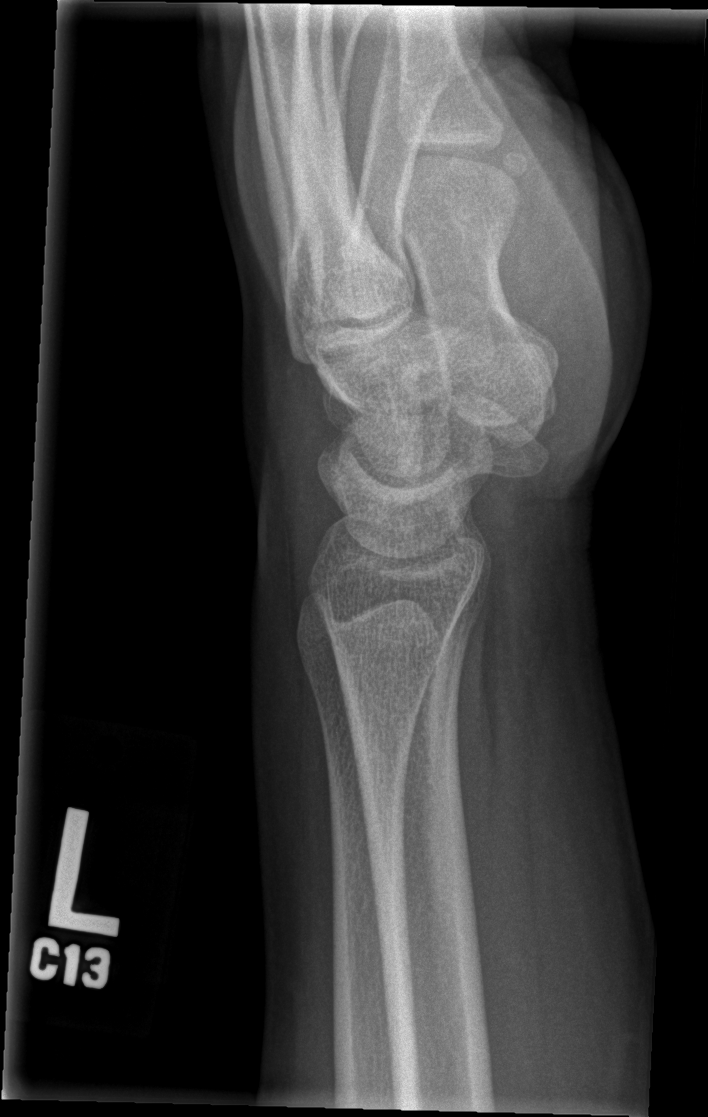
[im 4/4]
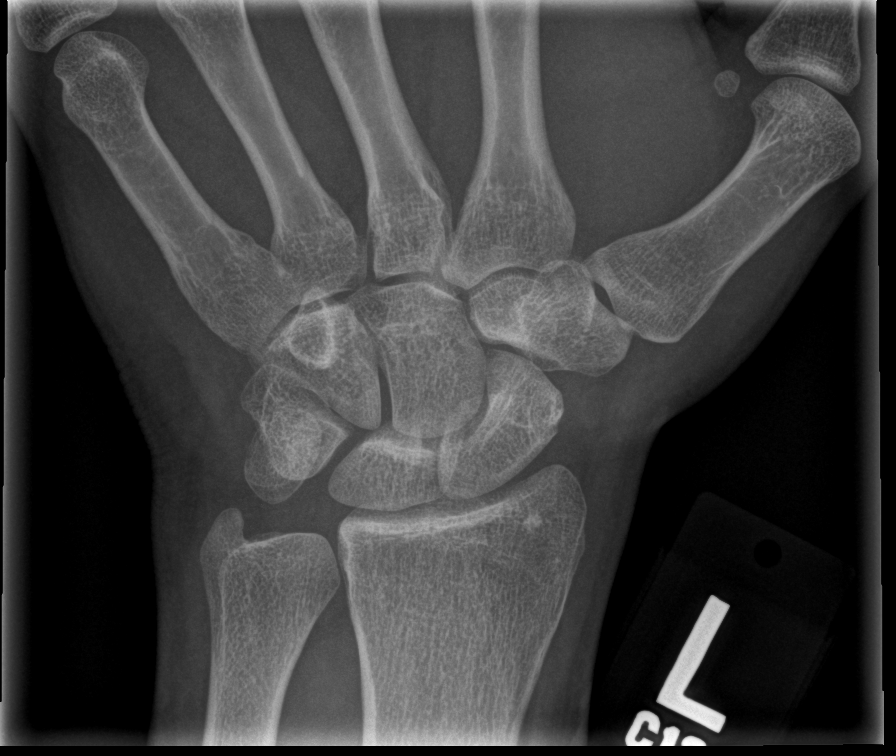

[4 of 4 positions shown; findings below may reference images not displayed]

FINDINGS: There is no evidence of fracture or dislocation. There is no
evidence of arthropathy or other focal bone abnormality. Soft
tissues are unremarkable.
IMPRESSION: No acute osseous injury of the left wrist.

## 2020-07-03 ENCOUNTER — Encounter: Payer: Self-pay | Admitting: Emergency Medicine

## 2020-07-03 ENCOUNTER — Other Ambulatory Visit: Payer: Self-pay

## 2020-07-03 ENCOUNTER — Emergency Department
Admission: EM | Admit: 2020-07-03 | Discharge: 2020-07-03 | Disposition: A | Discharge status: Home / Self Care | Payer: Medicaid Other | Attending: Emergency Medicine | Admitting: Emergency Medicine

## 2020-07-03 DIAGNOSIS — J069 Acute upper respiratory infection, unspecified: Secondary | ICD-10-CM | POA: Insufficient documentation

## 2020-07-03 DIAGNOSIS — F17210 Nicotine dependence, cigarettes, uncomplicated: Secondary | ICD-10-CM | POA: Insufficient documentation

## 2020-07-03 DIAGNOSIS — Z20822 Contact with and (suspected) exposure to covid-19: Secondary | ICD-10-CM | POA: Insufficient documentation

## 2020-07-03 LAB — RESP PANEL BY RT-PCR (FLU A&B, COVID) ARPGX2
Influenza A by PCR: NEGATIVE
Influenza B by PCR: NEGATIVE
SARS Coronavirus 2 by RT PCR: NEGATIVE

## 2020-07-03 MED ORDER — ONDANSETRON HCL 4 MG PO TABS: 4.0000 mg | ORAL_TABLET | Freq: Three times a day (TID) | ORAL | 0 refills | Status: DC | PRN

## 2020-07-03 MED ORDER — ONDANSETRON 4 MG PO TBDP
4.0000 mg | ORAL_TABLET | Freq: Once | ORAL | Status: AC
  Administered 2020-07-03: 4 mg via ORAL
  Filled 2020-07-03: qty 1

## 2020-07-03 NOTE — ED Notes (Signed)
Pt verbalized understanding of d/c instructions at this time. Prescriptions reviewed at this time. Pt given opportunity to ask questions as needed.   Pt ambulatory to ED lobby, NAD noted, RR even and unlabored.

## 2020-07-03 NOTE — ED Triage Notes (Signed)
C/O runny nose and sinus congestion.  Wants a COVID test

## 2020-07-03 NOTE — ED Provider Notes (Signed)
[facility] Emergency Department Provider Note  ____________________________________________   Event Date/Time   First MD Initiated Contact with Patient 07/03/20 1317     (approximate)  I have reviewed the triage vital signs and the nursing notes.   HISTORY  Chief Complaint URI   HPI Veer Elamin is a 35 y.o. male with a past medical history of scoliosis alcohol abuse and tobacco abuse who presents for assessment of 3 to 4 days of some congestion and cough as well as some nausea and diarrhea.  Denies any fevers, earache, sore throat, chest pain, shortness of breath, abdominal pain, blood in his urine, back pain, rash or extremity pain.  No recent falls or injuries.  No other acute concerns at this time.  He is requesting a COVID test.         Past Medical History:  Diagnosis Date  . ETOH abuse   . Scoliosis   . Sickle cell trait (HCC)     There are no problems to display for this patient.   History reviewed. No pertinent surgical history.  Prior to Admission medications   Medication Sig Start Date End Date Taking? Authorizing Provider  ondansetron (ZOFRAN) 4 MG tablet Take 1 tablet (4 mg total) by mouth every 8 (eight) hours as needed for up to 10 doses for nausea or vomiting. 07/03/20  Yes Gilles Chiquito, MD  meloxicam (MOBIC) 15 MG tablet Take 1 tablet (15 mg total) by mouth daily. 03/10/19   Tommi Rumps, PA-C  omeprazole (PRILOSEC) 10 MG capsule Take 1 capsule (10 mg total) by mouth daily. 11/12/19   Cuthriell, Delorise Royals, PA-C    Allergies Patient has no known allergies.  No family history on file.  Social History Social History   Tobacco Use  . Smoking status: Current Every Day Smoker    Packs/day: 0.50    Types: Cigarettes  . Smokeless tobacco: Never Used  Substance Use Topics  . Alcohol use: Yes  . Drug use: Never    Review of Systems  Review of Systems  Constitutional: Positive for chills and  malaise/fatigue. Negative for fever.  HENT: Positive for congestion. Negative for sore throat.   Eyes: Negative for pain.  Respiratory: Positive for cough. Negative for stridor.   Cardiovascular: Negative for chest pain.  Gastrointestinal: Positive for diarrhea and nausea. Negative for vomiting.  Genitourinary: Negative for dysuria.  Skin: Negative for rash.  Neurological: Positive for headaches. Negative for seizures and loss of consciousness.  Psychiatric/Behavioral: Negative for suicidal ideas.  All other systems reviewed and are negative.     ____________________________________________   PHYSICAL EXAM:  VITAL SIGNS: ED Triage Vitals [07/03/20 1311]  Enc Vitals Group     BP      Pulse      Resp      Temp      Temp src      SpO2      Weight 195 lb 15.8 oz (88.9 kg)     Height 5\' 4"  (1.626 m)     Head Circumference      Peak Flow      Pain Score 0     Pain Loc      Pain Edu?      Excl. in GC?    There were no vitals filed for this visit. Physical Exam Vitals and nursing note reviewed.  Constitutional:      Appearance: He is well-developed.  HENT:     Head: Normocephalic  and atraumatic.     Right Ear: External ear normal.     Left Ear: External ear normal.     Nose: Nose normal.  Eyes:     Conjunctiva/sclera: Conjunctivae normal.  Cardiovascular:     Rate and Rhythm: Normal rate and regular rhythm.     Heart sounds: No murmur heard.   Pulmonary:     Effort: Pulmonary effort is normal. No respiratory distress.     Breath sounds: Normal breath sounds.  Abdominal:     Palpations: Abdomen is soft.     Tenderness: There is no abdominal tenderness.  Musculoskeletal:     Cervical back: Neck supple. No rigidity.     Right lower leg: No edema.     Left lower leg: No edema.  Skin:    General: Skin is warm and dry.     Capillary Refill: Capillary refill takes less than 2 seconds.  Neurological:     Mental Status: He is alert and oriented to person, place, and  time.  Psychiatric:        Mood and Affect: Mood normal.     Cranials 2 through 12 grossly intact.  Oropharynx is unremarkable.  He is not meningitic.  No significant cervical lymphadenopathy.  Symmetric strength in upper and lower extremities. ____________________________________________   LABS (all labs ordered are listed, but only abnormal results are displayed)  Labs Reviewed  RESP PANEL BY RT-PCR (FLU A&B, COVID) [id number]   ____________________________________________  EKG  ____________________________________________  RADIOLOGY  ED MD interpretation:    Official radiology report(s): No results found.  ____________________________________________   PROCEDURES  Procedure(s) performed (including Critical Care):  Procedures   ____________________________________________   INITIAL IMPRESSION / ASSESSMENT AND PLAN / ED COURSE      Patient presents with above-stated history exam for assessment of couple days of cough, congestion, mild headaches, nausea, chills, malaise and some diarrhea.  On arrival he is afebrile and hemodynamically stable.  On exam there is no evidence of deep space infection i.e. retropharyngeal or peritonsillar abscess of the head or neck.  He is not meningitic there is no evidence of mastoiditis.  He has clear lungs bilaterally with fever Evalose patient for acute bacterial pneumonia at this time.  His abdomen is soft nontender and not consistent with diverticulitis appendicitis is no CVA tenderness or urinary symptoms to suggest pyelonephritis.  Overall impression is likely viral URI with some mild gastritis.  Will send COVID and flu.  Given stable vitals with eyes reassuring exam I think is safe for discharge with outpatient follow-up.  He was given a dose of Zofran and Rx was written for this.  Discharged stable condition.  Strict return precautions advised and discussed.       ____________________________________________   FINAL CLINICAL  IMPRESSION(S) / ED DIAGNOSES  Final diagnoses:  Upper respiratory tract infection, unspecified type  Person under investigation for COVID-19    Medications  ondansetron (ZOFRAN-ODT) disintegrating tablet 4 mg (has no administration in time range)     ED Discharge Orders         Ordered    ondansetron (ZOFRAN) 4 MG tablet  Every 8 hours PRN        07/03/20 1336           Note:  This document was prepared using Dragon voice recognition software and may include unintentional dictation errors.   Gilles Chiquito, MD 07/03/20 1340

## 2020-08-07 ENCOUNTER — Encounter: Payer: Self-pay | Admitting: *Deleted

## 2020-08-07 ENCOUNTER — Other Ambulatory Visit: Payer: Self-pay

## 2020-08-07 ENCOUNTER — Emergency Department
Admission: EM | Admit: 2020-08-07 | Discharge: 2020-08-07 | Disposition: A | Discharge status: Home / Self Care | Payer: Medicaid Other | Attending: Emergency Medicine | Admitting: Emergency Medicine

## 2020-08-07 DIAGNOSIS — F17210 Nicotine dependence, cigarettes, uncomplicated: Secondary | ICD-10-CM | POA: Insufficient documentation

## 2020-08-07 DIAGNOSIS — X58XXXA Exposure to other specified factors, initial encounter: Secondary | ICD-10-CM | POA: Insufficient documentation

## 2020-08-07 DIAGNOSIS — S39012A Strain of muscle, fascia and tendon of lower back, initial encounter: Secondary | ICD-10-CM | POA: Insufficient documentation

## 2020-08-07 MED ORDER — MELOXICAM 7.5 MG PO TABS
15.0000 mg | ORAL_TABLET | Freq: Once | ORAL | Status: AC
  Administered 2020-08-07: 17:00:00 15 mg via ORAL
  Filled 2020-08-07: qty 2

## 2020-08-07 MED ORDER — METHOCARBAMOL 500 MG PO TABS: 500.0000 mg | ORAL_TABLET | Freq: Four times a day (QID) | ORAL | 0 refills | Status: DC

## 2020-08-07 MED ORDER — MELOXICAM 15 MG PO TABS: 15.0000 mg | ORAL_TABLET | Freq: Every day | ORAL | 0 refills | Status: DC

## 2020-08-07 NOTE — ED Notes (Signed)
See triage note   Presents with lower back pain  States pain started 3 days ago  No known injury  Ambulates well to treatment room

## 2020-08-07 NOTE — ED Provider Notes (Signed)
[facility] Emergency Department Provider Note  ____________________________________________  Time seen: Approximately 4:59 PM  I have reviewed the triage vital signs and the nursing notes.   HISTORY  Chief Complaint Back Pain    HPI Brian Saunders is a 35 y.o. male who presents the emergency department complaining of lower back pain.  No traumatic injury and patient states symptoms have been ongoing x3 days.  He does have a history of scoliosis and will have intermittent issues with his back.  No radiation down his legs.  No bowel or bladder dysfunction, saddle anesthesia or paresthesia.  Patient states that it is a pulling/tight sensation in his left lower back.  No medications prior to arrival.       Past Medical History:  Diagnosis Date   ETOH abuse    Scoliosis    Sickle cell trait (HCC)     There are no problems to display for this patient.   No past surgical history on file.  Prior to Admission medications   Medication Sig Start Date End Date Taking? Authorizing Provider  meloxicam (MOBIC) 15 MG tablet Take 1 tablet (15 mg total) by mouth daily. 08/07/20  Yes Blaise Palladino, Delorise Royals, PA-C  methocarbamol (ROBAXIN) 500 MG tablet Take 1 tablet (500 mg total) by mouth 4 (four) times daily. 08/07/20  Yes Anahi Belmar, Delorise Royals, PA-C    Allergies Patient has no known allergies.  No family history on file.  Social History Social History   Tobacco Use   Smoking status: Every Day    Packs/day: 0.50    Pack years: 0.00    Types: Cigarettes   Smokeless tobacco: Never  Substance Use Topics   Alcohol use: Yes   Drug use: Never     Review of Systems  Constitutional: No fever/chills Eyes: No visual changes. No discharge ENT: No upper respiratory complaints. Cardiovascular: no chest pain. Respiratory: no cough. No SOB. Gastrointestinal: No abdominal pain.  No nausea, no vomiting.  No diarrhea.  No constipation. Genitourinary: Negative  for dysuria. No hematuria Musculoskeletal: Left lower back pain, nontraumatic Skin: Negative for rash, abrasions, lacerations, ecchymosis. Neurological: Negative for headaches, focal weakness or numbness.  10 System ROS otherwise negative.  ____________________________________________   PHYSICAL EXAM:  VITAL SIGNS: ED Triage Vitals  Enc Vitals Group     BP 08/07/20 1536 130/87     Pulse Rate 08/07/20 1536 (!) 107     Resp 08/07/20 1536 18     Temp 08/07/20 1536 98.6 F (37 C)     Temp Source 08/07/20 1536 Oral     SpO2 08/07/20 1536 97 %     Weight 08/07/20 1538 186 lb (84.4 kg)     Height 08/07/20 1538 5\' 4"  (1.626 m)     Head Circumference --      Peak Flow --      Pain Score 08/07/20 1537 8     Pain Loc --      Pain Edu? --      Excl. in GC? --      Constitutional: Alert and oriented. Well appearing and in no acute distress. Eyes: Conjunctivae are normal. PERRL. EOMI. Head: Atraumatic. ENT:      Ears:       Nose: No congestion/rhinnorhea.      Mouth/Throat: Mucous membranes are moist.  Neck: No stridor.    Cardiovascular: Normal rate, regular rhythm. Normal S1 and S2.  Good peripheral circulation. Respiratory: Normal respiratory effort without tachypnea or retractions. Lungs  CTAB. Good air entry to the bases with no decreased or absent breath sounds. Gastrointestinal: Bowel sounds 4 quadrants. Soft and nontender to palpation. No guarding or rigidity. No palpable masses. No distention. No CVA tenderness. Musculoskeletal: Full range of motion to all extremities. No gross deformities appreciated.  Visualization of lumbar spine reveals no visible signs of trauma.  No abrasions, lacerations, ecchymosis.  Nontender midline and right paraspinal muscle group.  Tender along the left paraspinal muscle group.  No palpable abnormality over the spine with step-off.  There is palpable spasms in the paraspinal muscle group.  No tenderness into the SI joint or sciatic notch.  Negative  straight leg raise bilaterally.  Dorsalis pedis pulse and sensation intact and equal bilateral lower extremities. Neurologic:  Normal speech and language. No gross focal neurologic deficits are appreciated.  Skin:  Skin is warm, dry and intact. No rash noted. Psychiatric: Mood and affect are normal. Speech and behavior are normal. Patient exhibits appropriate insight and judgement.   ____________________________________________   LABS (all labs ordered are listed, but only abnormal results are displayed)  Labs Reviewed - No data to display ____________________________________________  EKG   ____________________________________________  RADIOLOGY   No results found.  ____________________________________________    PROCEDURES  Procedure(s) performed:    Procedures    Medications - No data to display   ____________________________________________   INITIAL IMPRESSION / ASSESSMENT AND PLAN / ED COURSE  Pertinent labs & imaging results that were available during my care of the patient were reviewed by me and considered in my medical decision making (see chart for details).  Review of the [place] CSRS was performed in accordance of the [facility] prior to dispensing any controlled drugs.           Patient's diagnosis is consistent with lumbar strain.  Patient presented to the emergency department complaining of left lower back pain.  Patient presents with nontraumatic back pain x3 days.  Describes it as left-sided which is appreciated on physical exam with tenderness and palpable spasm along the left paraspinal muscle group.  No concerning neuro symptoms.  No indication for labs or imaging currently.  Patient be treated symptomatically with anti-inflammatory muscle relaxer.  Return precautions discussed with the patient.  Follow-up with primary care as needed..  Patient is given ED precautions to return to the ED for any worsening or new  symptoms.     ____________________________________________  FINAL CLINICAL IMPRESSION(S) / ED DIAGNOSES  Final diagnoses:  Strain of lumbar region, initial encounter      NEW MEDICATIONS STARTED DURING THIS VISIT:  ED Discharge Orders          Ordered    meloxicam (MOBIC) 15 MG tablet  Daily        08/07/20 1709    methocarbamol (ROBAXIN) 500 MG tablet  4 times daily        08/07/20 1709                This chart was dictated using voice recognition software/Dragon. Despite best efforts to proofread, errors can occur which can change the meaning. Any change was purely unintentional.    Racheal Patches, PA-C 08/07/20 1720    Phineas Semen, MD 08/07/20 1728

## 2020-08-07 NOTE — ED Triage Notes (Signed)
Pt has lower back pain for 3 days.   No known injury.  Denies urinary sx  pt alert  speech clear

## 2020-09-22 ENCOUNTER — Encounter: Payer: Self-pay | Admitting: Emergency Medicine

## 2020-09-22 ENCOUNTER — Other Ambulatory Visit: Payer: Self-pay

## 2020-09-22 ENCOUNTER — Emergency Department
Admission: EM | Admit: 2020-09-22 | Discharge: 2020-09-22 | Disposition: A | Discharge status: Home / Self Care | Payer: Medicaid Other | Attending: Emergency Medicine | Admitting: Emergency Medicine

## 2020-09-22 DIAGNOSIS — K439 Ventral hernia without obstruction or gangrene: Secondary | ICD-10-CM | POA: Insufficient documentation

## 2020-09-22 DIAGNOSIS — F17210 Nicotine dependence, cigarettes, uncomplicated: Secondary | ICD-10-CM | POA: Insufficient documentation

## 2020-09-22 LAB — COMPREHENSIVE METABOLIC PANEL
ALT: 32 U/L (ref 0–44)
AST: 34 U/L (ref 15–41)
Albumin: 4 g/dL (ref 3.5–5.0)
Alkaline Phosphatase: 72 U/L (ref 38–126)
Anion gap: 7 (ref 5–15)
BUN: 10 mg/dL (ref 6–20)
CO2: 26 mmol/L (ref 22–32)
Calcium: 9.7 mg/dL (ref 8.9–10.3)
Chloride: 106 mmol/L (ref 98–111)
Creatinine, Ser: 1 mg/dL (ref 0.61–1.24)
GFR, Estimated: >60 mL/min (ref >60)
Glucose, Bld: 143 mg/dL — ABNORMAL HIGH (ref 70–99)
Potassium: 4.1 mmol/L (ref 3.5–5.1)
Sodium: 139 mmol/L (ref 135–145)
Total Bilirubin: 0.7 mg/dL (ref 0.3–1.2)
Total Protein: 7.4 g/dL (ref 6.5–8.1)

## 2020-09-22 LAB — CBC
HCT: 42.4 % (ref 39.0–52.0)
Hemoglobin: 15.2 g/dL (ref 13.0–17.0)
MCH: 32.6 pg (ref 26.0–34.0)
MCHC: 35.8 g/dL (ref 30.0–36.0)
MCV: 91 fL (ref 80.0–100.0)
Platelets: 305 10*3/uL (ref 150–400)
RBC: 4.66 MIL/uL (ref 4.22–5.81)
RDW: 13.1 % (ref 11.5–15.5)
WBC: 6.7 10*3/uL (ref 4.0–10.5)
nRBC: 0 % (ref 0.0–0.2)

## 2020-09-22 LAB — URINALYSIS, COMPLETE (UACMP) WITH MICROSCOPIC
Bilirubin Urine: NEGATIVE
Glucose, UA: NEGATIVE mg/dL
Hgb urine dipstick: NEGATIVE
Ketones, ur: NEGATIVE mg/dL
Nitrite: NEGATIVE
Protein, ur: NEGATIVE mg/dL
Specific Gravity, Urine: 1.008 (ref 1.005–1.030)
pH: 6 (ref 5.0–8.0)

## 2020-09-22 LAB — LIPASE, BLOOD: Lipase: 31 U/L (ref 11–51)

## 2020-09-22 NOTE — ED Triage Notes (Signed)
Pt to ED via POV, pt states that he has a painful knot in his abdomen right above his belly button. Pt states that he first noticed it last night when he was working. Pt states that the area is more painful when trying to bend over. Pt is in NAD.

## 2020-09-22 NOTE — ED Provider Notes (Signed)
[facility] Emergency Department Provider Note   ____________________________________________    I have reviewed the triage vital signs and the nursing notes.   HISTORY  Chief Complaint Abdominal Pain     HPI Brian Saunders is a 35 y.o. male with history as noted below who presents with complaints of abdominal discomfort.  Patient reports he noticed yesterday a bulge in his abdomen just above his bellybutton, he reports it was uncomfortable but not particularly painful.  He has never had this before.  Denies injury to the area.  No nausea vomiting.  No fevers.  No erythema.  Past Medical History:  Diagnosis Date   ETOH abuse    Scoliosis    Sickle cell trait (HCC)     There are no problems to display for this patient.   History reviewed. No pertinent surgical history.  Prior to Admission medications   Medication Sig Start Date End Date Taking? Authorizing Provider  meloxicam (MOBIC) 15 MG tablet Take 1 tablet (15 mg total) by mouth daily. 08/07/20   Cuthriell, Delorise Royals, PA-C  methocarbamol (ROBAXIN) 500 MG tablet Take 1 tablet (500 mg total) by mouth 4 (four) times daily. 08/07/20   Cuthriell, Delorise Royals, PA-C     Allergies Patient has no known allergies.  No family history on file.  Social History Social History   Tobacco Use   Smoking status: Every Day    Packs/day: 0.50    Types: Cigarettes   Smokeless tobacco: Never  Substance Use Topics   Alcohol use: Yes   Drug use: Never    Review of Systems  Constitutional: No fever/chills Eyes: No visual changes.  ENT: No sore throat. Cardiovascular: Denies chest pain. Respiratory: Denies shortness of breath. Gastrointestinal: As above Genitourinary: Negative for dysuria. Musculoskeletal: Negative for back pain. Skin: Negative for rash. Neurological: Negative for headaches or weakness   ____________________________________________   PHYSICAL EXAM:  VITAL SIGNS: ED  Triage Vitals  Enc Vitals Group     BP 09/22/20 1428 (!) 131/95     Pulse Rate 09/22/20 1428 84     Resp 09/22/20 1428 16     Temp 09/22/20 1428 98.5 F (36.9 C)     Temp Source 09/22/20 1428 Oral     SpO2 09/22/20 1428 98 %     Weight 09/22/20 1426 84.4 kg (186 lb)     Height 09/22/20 1426 1.626 m (5\' 4" )     Head Circumference --      Peak Flow --      Pain Score 09/22/20 1425 7     Pain Loc --      Pain Edu? --      Excl. in GC? --     Constitutional: Alert and oriented. No acute distress. Pleasant and interactive  Nose: No congestion/rhinnorhea. Mouth/Throat: Mucous membranes are moist.   Neck:  Painless ROM Cardiovascular:  Good peripheral circulation. Respiratory: Normal respiratory effort.  No retractions.  Gastrointestinal: Soft and nontender. No distention.  No CVA tenderness.  Small ventral hernia noted, easily reducible, no tenderness or erythema  Musculoskeletal: No lower extremity tenderness nor edema.  Warm and well perfused  Skin:  Skin is warm, dry and intact. No rash noted. Psychiatric: Mood and affect are normal. Speech and behavior are normal.  ____________________________________________   LABS (all labs ordered are listed, but only abnormal results are displayed)  Labs Reviewed  COMPREHENSIVE METABOLIC PANEL - Abnormal; Notable for the following components:  Result Value   Glucose, Bld 143 (*)    All other components within normal limits  URINALYSIS, COMPLETE (UACMP) WITH MICROSCOPIC - Abnormal; Notable for the following components:   Color, Urine STRAW (*)    APPearance HAZY (*)    Leukocytes,Ua TRACE (*)    Bacteria, UA RARE (*)    All other components within normal limits  LIPASE, BLOOD  CBC   ____________________________________________  EKG  None ____________________________________________  RADIOLOGY  None ____________________________________________   PROCEDURES  Procedure(s) performed: No  Procedures   Critical  Care performed: No ____________________________________________   INITIAL IMPRESSION / ASSESSMENT AND PLAN / ED COURSE  Pertinent labs & imaging results that were available during my care of the patient were reviewed by me and considered in my medical decision making (see chart for details).   Patient well-appearing and in no acute distress.  Presents with abdominal discomfort as noted above.  Exam is consistent with ventral hernia, easily reducible.  Lab work obtained is unremarkable.  Recommend outpatient follow-up with general surgery for definitive repair.  Return precautions discussed    ____________________________________________   FINAL CLINICAL IMPRESSION(S) / ED DIAGNOSES  Final diagnoses:  Ventral hernia without obstruction or gangrene        Note:  This document was prepared using Dragon voice recognition software and may include unintentional dictation errors.    Jene Every, MD 09/22/20 [phone]

## 2020-09-26 ENCOUNTER — Encounter: Payer: Self-pay | Admitting: Surgery

## 2020-09-26 ENCOUNTER — Ambulatory Visit (INDEPENDENT_AMBULATORY_CARE_PROVIDER_SITE_OTHER): Payer: Self-pay | Admitting: Surgery

## 2020-09-26 ENCOUNTER — Other Ambulatory Visit: Payer: Self-pay

## 2020-09-26 ENCOUNTER — Ambulatory Visit: Payer: Self-pay | Admitting: Surgery

## 2020-09-26 VITALS — BP 132/95 | HR 95 | Temp 98.8°F | Ht 65.0 in | Wt 197.0 lb

## 2020-09-26 DIAGNOSIS — K429 Umbilical hernia without obstruction or gangrene: Secondary | ICD-10-CM

## 2020-09-26 DIAGNOSIS — M6208 Separation of muscle (nontraumatic), other site: Secondary | ICD-10-CM | POA: Insufficient documentation

## 2020-09-26 NOTE — Patient Instructions (Signed)
You have requested to have a Ventral Hernia Repair. This will be done  by Dr Claudine Mouton at [facility]. Please see your (BLUE) Pre-care sheet for more information. Our surgery scheduler will call you to look at surgery dates and go over information.   You will need to arrange to be out of work for approximately 1-2 weeks and then you may return with a lifting restriction for 4 more weeks. If you have FMLA or Disability paperwork that needs to be filled out, please have your company fax your paperwork to [phone] or you may drop this by either office. This paperwork will be filled out within 3 days after your surgery has been completed.    Ventral Hernia A ventral hernia (also called an incisional hernia) is a hernia that occurs at the site of a previous surgical cut (incision) in the abdomen. The abdominal wall spans from your lower chest down to your pelvis. If the abdominal wall is weakened from a surgical incision, a hernia can occur. A hernia is a bulge of bowel or muscle tissue pushing out on the weakened part of the abdominal wall. Ventral hernias can get bigger from straining or lifting. Obese and older people are at higher risk for a ventral hernia. People who develop infections after surgery or require repeat incisions at the same site on the abdomen are also at increased risk. CAUSES  A ventral hernia occurs because of weakness in the abdominal wall at an incision site.  SYMPTOMS  Common symptoms include: A visible bulge or lump on the abdominal wall. Pain or tenderness around the lump. Increased discomfort if you cough or make a sudden movement. If the hernia has blocked part of the intestine, a serious complication can occur (incarcerated or strangulated hernia). This can become a problem that requires emergency surgery because the blood flow to the blocked intestine may be cut off. Symptoms may include: Feeling sick to your stomach (nauseous). Throwing up (vomiting). Stomach swelling  (distention) or bloating. Fever. Rapid heartbeat. DIAGNOSIS  Your health care provider will take a medical history and perform a physical exam. Various tests may be ordered, such as: Blood tests. Urine tests. Ultrasonography. X-rays. Computed tomography (CT). TREATMENT  Watchful waiting may be all that is needed for a smaller hernia that does not cause symptoms. Your health care provider may recommend the use of a supportive belt (truss) that helps to keep the abdominal wall intact. For larger hernias or those that cause pain, surgery to repair the hernia is usually recommended. If a hernia becomes strangulated, emergency surgery needs to be done right away. HOME CARE INSTRUCTIONS Avoid putting pressure or strain on the abdominal area. Avoid heavy lifting. Use good body positioning for physical tasks. Ask your health care provider about proper body positioning. Use a supportive belt as directed by your health care provider. Maintain a healthy weight. Eat foods that are high in fiber, such as whole grains, fruits, and vegetables. Fiber helps prevent difficult bowel movements (constipation). Drink enough fluids to keep your urine clear or pale yellow. Follow up with your health care provider as directed. SEEK MEDICAL CARE IF:  Your hernia seems to be getting larger or more painful. SEEK IMMEDIATE MEDICAL CARE IF:  You have abdominal pain that is sudden and sharp. Your pain becomes severe. You have repeated vomiting. You are sweating a lot. You notice a rapid heartbeat. You develop a fever. MAKE SURE YOU:  Understand these instructions. Will watch your condition. Will get help right away  if you are not doing well or get worse.     Open Ventral Hernia Repair Open ventral hernia repair is a surgery to fix a ventral hernia. A ventral hernia,  is a bulge of body tissue or intestines that pushes through the front part of the abdomen. This can happen if the connective tissue covering the  muscles over the abdomen has a weak spot or is torn because of a surgical cut (incision) from a previous surgery. A ventral hernia repair is often done soon after diagnosis to stop the hernia from getting bigger, becoming uncomfortable, or becoming an emergency. This surgery usually takes about 2 hours, but the time can vary greatly.  LET [facility] CARE PROVIDER KNOW ABOUT: Any allergies you have. All medicines you are taking, including steroids, vitamins, herbs, eye drops, creams, and over-the-counter medicines. Previous problems you or members of your family have had with the use of anesthetics. Any blood disorders you have. Previous surgeries you have had. Medical conditions you have.  RISKS AND COMPLICATIONS  Generally, Open ventral hernia repair is a safe procedure. However, as with any surgical procedure, problems can occur. Possible problems include: Bleeding. Trouble passing urine or having a bowel movement after the surgery. Infection. Pneumonia. Blood clots. Pain in the area of the hernia. A bulge in the area of the hernia that may be caused by a collection of fluid. Injury to intestines or other structures in the abdomen. Return of the hernia after surgery.  BEFORE THE PROCEDURE  You may need to have blood tests, urine tests, a chest X-ray, or an electrocardiogram done before the day of the surgery. Ask your health care provider about changing or stopping your regular medicines. This is especially important if you are taking diabetes medicines or blood thinners. You may need to wash with a special type of germ-killing soap. Do not eat or drink anything after midnight the night before the procedure or as directed by your health care provider. Make plans to have someone drive you home after the procedure.  PROCEDURE  Small monitors will be put on your body. They are used to check your heart, blood pressure, and oxygen level. An IV access tube will be put into a vein in your  hand or arm. Fluids and medicine will flow directly into your body through the IV tube. You will be given medicine that makes you go to sleep (general anesthetic). Your abdomen will be cleaned with a special soap to kill any germs on your skin. Once you are asleep, a moderate - large size incision will be made in your abdomen. The size of incision depends on how large your hernia is. Your surgeon puts the tissue or intestines that formed the hernia back in place. A screen-like patch (mesh) is used to close the hernia. This helps make the area stronger. Stitches, tacks, or staples are used to keep the mesh in place. Medicine and a bandage (dressing) or skin glue will be put over the incision.  AFTER THE PROCEDURE  You will stay in a recovery area until the anesthetic wears off. Your blood pressure and pulse will be checked often. You may be able to go home the same day or may need to stay in the hospital for 1-2 days after surgery. Your surgeon will decide when you can go home depending upon your recovery. You may feel some pain. You will be given medicine for pain. You will be urged to do breathing exercises that involve taking deep  breaths. This helps prevent a lung infection after a surgery. You may have to wear compression stockings while you are in the hospital. These stockings help keep blood clots from forming in your legs.   This information is not intended to replace advice given to you by your health care provider. Make sure you discuss any questions you have with your health care provider.   Document Released: 01/27/2012 Document Revised: 02/14/2013 Document Reviewed: 01/27/2012 Elsevier Interactive Patient Education [employer].

## 2020-09-26 NOTE — H&P (View-Only) (Signed)
Patient ID: Brian Saunders, male   DOB: 1985-10-16, 35 y.o.   MRN: [id number]  Chief Complaint: Umbilical hernia  History of Present Illness Brian Saunders is a 35 y.o. 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  Pain is centered at the umbilicus.  There is an asymmetry to his umbilicus.  Was seen in the ER, no imaging was obtained.  Pain has been exacerbated by bending, but not by coughing or sneezing.  Past Medical History Past Medical History:  Diagnosis Date   ETOH abuse    GERD (gastroesophageal reflux disease)    Scoliosis    Sickle cell trait (HCC)       History reviewed. No pertinent surgical history.  No Known Allergies  Current Outpatient Medications  Medication Sig Dispense Refill   calcium carbonate (TUMS - DOSED IN MG ELEMENTAL CALCIUM) 500 MG chewable tablet Chew 1 tablet by mouth daily.     No current facility-administered medications for this visit.    Family History Family History  Problem Relation Age of Onset   Diabetes Maternal Grandmother    Diabetes Paternal Grandmother    Diabetes Paternal Grandfather       Social History Social History   Tobacco Use   Smoking status: Every Day    Packs/day: 0.50    Types: Cigarettes   Smokeless tobacco: Never  Vaping Use   Vaping Use: Never used  Substance Use Topics   Alcohol use: Yes   Drug use: Never        Review of Systems  Constitutional: Negative.   HENT: Negative.    Eyes: Negative.   Respiratory: Negative.    Cardiovascular: Negative.   Gastrointestinal:  Positive for heartburn.  Genitourinary: Negative.   Skin: Negative.   Neurological:  Positive for headaches.  Psychiatric/Behavioral: Negative.       Physical Exam Blood pressure (!) 132/95, pulse 95, temperature 98.8 F (37.1 C), height 5\' 5"  (1.651 m), weight 197 lb (89.4 kg), SpO2 98 %. Last Weight  Most recent update: 09/26/2020  2:39 PM    Weight  89.4 kg (197 lb)              CONSTITUTIONAL: Well developed, and nourished, appropriately responsive and aware without distress.   EYES: Sclera non-icteric.   EARS, NOSE, MOUTH AND THROAT: Mask worn.   The oropharynx is clear. Oral mucosa is pink and moist.   Hearing is intact to voice.  NECK: Trachea is midline, and there is no jugular venous distension.  LYMPH NODES:  Lymph nodes in the neck are not enlarged. RESPIRATORY:  Lungs are clear, and breath sounds are equal bilaterally. Normal respiratory effort without pathologic use of accessory muscles. CARDIOVASCULAR: Heart is regular in rate and rhythm. GI: The abdomen is  soft, nontender, and nondistended. There were no palpable masses. I did not appreciate hepatosplenomegaly. There were normal bowel sounds.  There is a tender bump to the left of midline at the umbilical concavity.  Nonreducible consistent with preperitoneal fat and small fascial defect.  There is diastases recti without appreciable epigastric defect. MUSCULOSKELETAL:  Symmetrical muscle tone appreciated in all four extremities.    SKIN: Skin turgor is normal. No pathologic skin lesions appreciated.  NEUROLOGIC:  Motor and sensation appear grossly normal.  Cranial nerves are grossly without defect. PSYCH:  Alert and oriented to person, place and time. Affect is appropriate for situation.  Data Reviewed I have personally reviewed what is currently available  of the patient's imaging, recent labs and medical records.   Labs:  CBC Latest Ref Rng & Units 09/22/2020 02/18/2020 11/12/2019  WBC 4.0 - 10.5 K/uL 6.7 10.4 6.0  Hemoglobin 13.0 - 17.0 g/dL 15.2 16.5 15.3  Hematocrit 39.0 - 52.0 % 42.4 46.6 43.7  Platelets 150 - 400 K/uL 305 305 290   CMP Latest Ref Rng & Units 09/22/2020 02/18/2020 11/12/2019  Glucose 70 - 99 mg/dL 143(H) 119(H) 145(H)  BUN 6 - 20 mg/dL 10 11 9  Creatinine 0.61 - 1.24 mg/dL 1.00 1.11 1.08  Sodium 135 - 145 mmol/L 139 133(L) 135  Potassium 3.5 - 5.1 mmol/L 4.1 3.9 4.0  Chloride  98 - 111 mmol/L 106 102 100  CO2 22 - 32 mmol/L 26 21(L) 22  Calcium 8.9 - 10.3 mg/dL 9.7 9.2 9.2  Total Protein 6.5 - 8.1 g/dL 7.4 8.3(H) 7.7  Total Bilirubin 0.3 - 1.2 mg/dL 0.7 0.8 0.9  Alkaline Phos 38 - 126 U/L 72 92 72  AST 15 - 41 U/L 34 36 63(H)  ALT 0 - 44 U/L 32 38 59(H)      Imaging:  Within last 24 hrs: No results found.  Assessment    Umbilical hernia. Diastases recti. There are no problems to display for this patient.   Plan    We discussed options with umbilical hernia repair.  I recommend primary repair, considering the size of the fascial defect present. I discussed possibility of incarceration, strangulation, enlargement in size over time, and the need for emergency surgery in the face of these.  Also reviewed the techniques of reduction should incarceration occur, and when unsuccessful to present to the ED.  Also discussed that surgery risks include recurrence which can be up to 30% in the case of complex hernias, use of prosthetic materials (mesh) and the increased risk of infection and the possible need for re-operation and removal of mesh, possibility of post-op SBO or ileus, and the risks of general anesthetic including heart attack, stroke, sudden death or some reaction to anesthetic medications. The patient, and those present, appear to understand the risks, any and all questions were answered to the patient's satisfaction.  No guarantees were ever expressed or implied.   Face-to-face time spent with the patient and accompanying care providers(if present) was 25 minutes, with more than 50% of the time spent counseling, educating, and coordinating care of the patient.    These notes generated with voice recognition software. I apologize for typographical errors.  Noela Brothers M.D., FACS 09/26/2020, 3:08 PM     

## 2020-09-26 NOTE — Progress Notes (Signed)
Patient ID: Brian Saunders, male   DOB: 1985-10-16, 35 y.o.   MRN: [id number]  Chief Complaint: Umbilical hernia  History of Present Illness Brian Saunders is a 35 y.o. 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  Pain is centered at the umbilicus.  There is an asymmetry to his umbilicus.  Was seen in the ER, no imaging was obtained.  Pain has been exacerbated by bending, but not by coughing or sneezing.  Past Medical History Past Medical History:  Diagnosis Date   ETOH abuse    GERD (gastroesophageal reflux disease)    Scoliosis    Sickle cell trait (HCC)       History reviewed. No pertinent surgical history.  No Known Allergies  Current Outpatient Medications  Medication Sig Dispense Refill   calcium carbonate (TUMS - DOSED IN MG ELEMENTAL CALCIUM) 500 MG chewable tablet Chew 1 tablet by mouth daily.     No current facility-administered medications for this visit.    Family History Family History  Problem Relation Age of Onset   Diabetes Maternal Grandmother    Diabetes Paternal Grandmother    Diabetes Paternal Grandfather       Social History Social History   Tobacco Use   Smoking status: Every Day    Packs/day: 0.50    Types: Cigarettes   Smokeless tobacco: Never  Vaping Use   Vaping Use: Never used  Substance Use Topics   Alcohol use: Yes   Drug use: Never        Review of Systems  Constitutional: Negative.   HENT: Negative.    Eyes: Negative.   Respiratory: Negative.    Cardiovascular: Negative.   Gastrointestinal:  Positive for heartburn.  Genitourinary: Negative.   Skin: Negative.   Neurological:  Positive for headaches.  Psychiatric/Behavioral: Negative.       Physical Exam Blood pressure (!) 132/95, pulse 95, temperature 98.8 F (37.1 C), height 5\' 5"  (1.651 m), weight 197 lb (89.4 kg), SpO2 98 %. Last Weight  Most recent update: 09/26/2020  2:39 PM    Weight  89.4 kg (197 lb)              CONSTITUTIONAL: Well developed, and nourished, appropriately responsive and aware without distress.   EYES: Sclera non-icteric.   EARS, NOSE, MOUTH AND THROAT: Mask worn.   The oropharynx is clear. Oral mucosa is pink and moist.   Hearing is intact to voice.  NECK: Trachea is midline, and there is no jugular venous distension.  LYMPH NODES:  Lymph nodes in the neck are not enlarged. RESPIRATORY:  Lungs are clear, and breath sounds are equal bilaterally. Normal respiratory effort without pathologic use of accessory muscles. CARDIOVASCULAR: Heart is regular in rate and rhythm. GI: The abdomen is  soft, nontender, and nondistended. There were no palpable masses. I did not appreciate hepatosplenomegaly. There were normal bowel sounds.  There is a tender bump to the left of midline at the umbilical concavity.  Nonreducible consistent with preperitoneal fat and small fascial defect.  There is diastases recti without appreciable epigastric defect. MUSCULOSKELETAL:  Symmetrical muscle tone appreciated in all four extremities.    SKIN: Skin turgor is normal. No pathologic skin lesions appreciated.  NEUROLOGIC:  Motor and sensation appear grossly normal.  Cranial nerves are grossly without defect. PSYCH:  Alert and oriented to person, place and time. Affect is appropriate for situation.  Data Reviewed I have personally reviewed what is currently available  of the patient's imaging, recent labs and medical records.   Labs:  CBC Latest Ref Rng & Units 09/22/2020 02/18/2020 11/12/2019  WBC 4.0 - 10.5 K/uL 6.7 10.4 6.0  Hemoglobin 13.0 - 17.0 g/dL 46.2 70.3 50.0  Hematocrit 39.0 - 52.0 % 42.4 46.6 43.7  Platelets 150 - 400 K/uL 305 305 290   CMP Latest Ref Rng & Units 09/22/2020 02/18/2020 11/12/2019  Glucose 70 - 99 mg/dL 938(H) 829(H) 371(I)  BUN 6 - 20 mg/dL 10 11 9   Creatinine 0.61 - 1.24 mg/dL 9.67 8.93  Sodium 135 - 145 mmol/L 139 133(L) 135  Potassium 3.5 - 5.1 mmol/L 4.1 3.9 4.0  Chloride  98 - 111 mmol/L 106 102 100  CO2 22 - 32 mmol/L 26 21(L) 22  Calcium 8.9 - 10.3 mg/dL 9.7 9.2 9.2  Total Protein 6.5 - 8.1 g/dL 7.4 8.10) 7.7  Total Bilirubin 0.3 - 1.2 mg/dL 0.7 0.8 0.9  Alkaline Phos 38 - 126 U/L 72 92 72  AST 15 - 41 U/L 34 36 63(H)  ALT 0 - 44 U/L 32 38 59(H)      Imaging:  Within last 24 hrs: No results found.  Assessment    Umbilical hernia. Diastases recti. There are no problems to display for this patient.   Plan    We discussed options with umbilical hernia repair.  I recommend primary repair, considering the size of the fascial defect present. I discussed possibility of incarceration, strangulation, enlargement in size over time, and the need for emergency surgery in the face of these.  Also reviewed the techniques of reduction should incarceration occur, and when unsuccessful to present to the ED.  Also discussed that surgery risks include recurrence which can be up to 30% in the case of complex hernias, use of prosthetic materials (mesh) and the increased risk of infection and the possible need for re-operation and removal of mesh, possibility of post-op SBO or ileus, and the risks of general anesthetic including heart attack, stroke, sudden death or some reaction to anesthetic medications. The patient, and those present, appear to understand the risks, any and all questions were answered to the patient's satisfaction.  No guarantees were ever expressed or implied.   Face-to-face time spent with the patient and accompanying care providers(if present) was 25 minutes, with more than 50% of the time spent counseling, educating, and coordinating care of the patient.    These notes generated with voice recognition software. I apologize for typographical errors.  1.7(P M.D., FACS 09/26/2020, 3:08 PM

## 2020-09-27 ENCOUNTER — Telehealth: Payer: Self-pay | Admitting: Surgery

## 2020-09-27 NOTE — Telephone Encounter (Signed)
Updated information, left message to call.  Please inform patient of Pre-Admission date/time, COVID Testing date and Surgery date.  Surgery Date: 10/02/20 Preadmission Testing Date: 10/01/20 (phone 8a-1p) Covid Testing Date: Not needed.    Also patient will need to call at [phone], between 1-3:00pm the day before surgery, to find out what time to arrive for surgery.

## 2020-09-27 NOTE — Telephone Encounter (Signed)
Patient notified of surgery date and pre-admit date and time.

## 2020-10-01 ENCOUNTER — Other Ambulatory Visit
Admission: RE | Admit: 2020-10-01 | Discharge: 2020-10-01 | Disposition: A | Discharge status: Home / Self Care | Payer: Medicaid Other | Source: Ambulatory Visit | Attending: Surgery | Admitting: Surgery

## 2020-10-01 ENCOUNTER — Other Ambulatory Visit: Payer: Self-pay

## 2020-10-01 HISTORY — DX: Anxiety disorder, unspecified: F41.9

## 2020-10-01 HISTORY — DX: Headache, unspecified: R51.9

## 2020-10-01 NOTE — Patient Instructions (Signed)
Your procedure is scheduled on: 10/02/20 Report to the Registration Desk on the 1st floor of the [facility]. To find out your arrival time, please call [phone] between 1PM - 3PM on: 10/01/20  REMEMBER: Instructions that are not followed completely may result in serious medical risk, up to and including death; or upon the discretion of your surgeon and anesthesiologist your surgery may need to be rescheduled.  Do not eat food after midnight the night before surgery.  No gum chewing, lozengers or hard candies.  You may however, drink CLEAR liquids up to 2 hours before you are scheduled to arrive for your surgery. Do not drink anything within 2 hours of your scheduled arrival time.  Clear liquids include: - water  - apple juice without pulp - gatorade (not RED, PURPLE, OR BLUE) - black coffee or tea (Do NOT add milk or creamers to the coffee or tea) Do NOT drink anything that is not on this list.  TAKE THESE MEDICATIONS THE MORNING OF SURGERY WITH A SIP OF WATER: none  One week prior to surgery: Stop Anti-inflammatories (NSAIDS) such as Advil, Aleve, Ibuprofen, Motrin, Naproxen, Naprosyn and Aspirin based products such as Excedrin, Goodys Powder, BC Powder.  Stop ANY OVER THE COUNTER supplements until after surgery.  You may however, continue to take Tylenol if needed for pain up until the day of surgery.  No Alcohol for 24 hours before or after surgery.  No Smoking including e-cigarettes for 24 hours prior to surgery.  No chewable tobacco products for at least 6 hours prior to surgery.  No nicotine patches on the day of surgery.  Do not use any "recreational" drugs for at least a week prior to your surgery.  Please be advised that the combination of cocaine and anesthesia may have negative outcomes, up to and including death. If you test positive for cocaine, your surgery will be cancelled.  On the morning of surgery brush your teeth with toothpaste and water, you may  rinse your mouth with mouthwash if you wish. Do not swallow any toothpaste or mouthwash.  Do not wear jewelry, make-up, hairpins, clips or nail polish.  Do not wear lotions, powders, or perfumes.   Do not shave body from the neck down 48 hours prior to surgery just in case you cut yourself which could leave a site for infection.  Also, freshly shaved skin may become irritated if using the CHG soap.  Contact lenses, hearing aids and dentures may not be worn into surgery.  Do not bring valuables to the hospital. [facility] is not responsible for any missing/lost belongings or valuables.   Notify your doctor if there is any change in your medical condition (cold, fever, infection).  Wear comfortable clothing (specific to your surgery type) to the hospital.  After surgery, you can help prevent lung complications by doing breathing exercises.  Take deep breaths and cough every 1-2 hours. Your doctor may order a device called an Incentive Spirometer to help you take deep breaths. When coughing or sneezing, hold a pillow firmly against your incision with both hands. This is called "splinting." Doing this helps protect your incision. It also decreases belly discomfort.  If you are being admitted to the hospital overnight, leave your suitcase in the car. After surgery it may be brought to your room.  If you are being discharged the day of surgery, you will not be allowed to drive home. You will need a responsible adult (18 years or older) to drive  you home and stay with you that night.   If you are taking public transportation, you will need to have a responsible adult (18 years or older) with you. Please confirm with your physician that it is acceptable to use public transportation.   Please call the Pre-admissions Testing Dept. at [phone] if you have any questions about these instructions.  Surgery Visitation Policy:  Patients undergoing a surgery or procedure may have one family  member or support person with them as long as that person is not COVID-19 positive or experiencing its symptoms.  That person may remain in the waiting area during the procedure.  Inpatient Visitation:    Visiting hours are 7 a.m. to 8 p.m. Inpatients will be allowed two visitors daily. The visitors may change each day during the patient's stay. No visitors under the age of 4. Any visitor under the age of 46 must be accompanied by an adult. The visitor must pass COVID-19 screenings, use hand sanitizer when entering and exiting the patient's room and wear a mask at all times, including in the patient's room. Patients must also wear a mask when staff or their visitor are in the room. Masking is required regardless of vaccination status.

## 2020-10-02 ENCOUNTER — Ambulatory Visit: Payer: Self-pay

## 2020-10-02 ENCOUNTER — Other Ambulatory Visit: Payer: Self-pay

## 2020-10-02 ENCOUNTER — Encounter
Admission: RE | Disposition: A | Discharge status: Home / Self Care | Payer: Self-pay | Source: Home / Self Care | Attending: Surgery

## 2020-10-02 ENCOUNTER — Encounter: Payer: Self-pay | Admitting: Surgery

## 2020-10-02 ENCOUNTER — Ambulatory Visit
Admission: RE | Admit: 2020-10-02 | Discharge: 2020-10-02 | Disposition: A | Discharge status: Home / Self Care | Payer: Self-pay | Attending: Surgery | Admitting: Surgery

## 2020-10-02 DIAGNOSIS — K429 Umbilical hernia without obstruction or gangrene: Secondary | ICD-10-CM | POA: Insufficient documentation

## 2020-10-02 DIAGNOSIS — F17210 Nicotine dependence, cigarettes, uncomplicated: Secondary | ICD-10-CM | POA: Insufficient documentation

## 2020-10-02 HISTORY — PX: UMBILICAL HERNIA REPAIR: SHX196

## 2020-10-02 SURGERY — REPAIR, HERNIA, UMBILICAL, ADULT
Anesthesia: General

## 2020-10-02 MED ORDER — OXYCODONE HCL 5 MG PO TABS
5.0000 mg | ORAL_TABLET | Freq: Once | ORAL | Status: AC | PRN
  Administered 2020-10-02: 5 mg via ORAL

## 2020-10-02 MED ORDER — CEFAZOLIN SODIUM-DEXTROSE 2-4 GM/100ML-% IV SOLN
INTRAVENOUS | Status: AC
  Filled 2020-10-02: qty 100

## 2020-10-02 MED ORDER — PROPOFOL 10 MG/ML IV BOLUS
INTRAVENOUS | Status: AC
  Filled 2020-10-02: qty 40

## 2020-10-02 MED ORDER — MIDAZOLAM HCL 2 MG/2ML IJ SOLN
INTRAMUSCULAR | Status: DC | PRN
  Administered 2020-10-02: 2 mg via INTRAVENOUS

## 2020-10-02 MED ORDER — HYDROCODONE-ACETAMINOPHEN 5-325 MG PO TABS: 1.0000 | ORAL_TABLET | Freq: Four times a day (QID) | ORAL | 0 refills | Status: DC | PRN

## 2020-10-02 MED ORDER — FENTANYL CITRATE (PF) 100 MCG/2ML IJ SOLN: 25.0000 ug | INTRAMUSCULAR | Status: DC | PRN

## 2020-10-02 MED ORDER — DEXAMETHASONE SODIUM PHOSPHATE 10 MG/ML IJ SOLN
INTRAMUSCULAR | Status: AC
  Filled 2020-10-02: qty 1

## 2020-10-02 MED ORDER — FAMOTIDINE 20 MG PO TABS
ORAL_TABLET | ORAL | Status: AC
  Administered 2020-10-02: 20 mg via ORAL
  Filled 2020-10-02: qty 1

## 2020-10-02 MED ORDER — EPHEDRINE 5 MG/ML INJ
INTRAVENOUS | Status: AC
  Filled 2020-10-02: qty 5

## 2020-10-02 MED ORDER — ONDANSETRON HCL 4 MG/2ML IJ SOLN: 4.0000 mg | Freq: Once | INTRAMUSCULAR | Status: DC | PRN

## 2020-10-02 MED ORDER — CEFAZOLIN SODIUM-DEXTROSE 2-4 GM/100ML-% IV SOLN
2.0000 g | INTRAVENOUS | Status: AC
Start: 2020-10-02 — End: 2020-10-02
  Administered 2020-10-02: 2 g via INTRAVENOUS

## 2020-10-02 MED ORDER — BUPIVACAINE HCL (PF) 0.5 % IJ SOLN
INTRAMUSCULAR | Status: AC
  Filled 2020-10-02: qty 20

## 2020-10-02 MED ORDER — CHLORHEXIDINE GLUCONATE CLOTH 2 % EX PADS: 6.0000 | MEDICATED_PAD | Freq: Once | CUTANEOUS | Status: DC

## 2020-10-02 MED ORDER — BUPIVACAINE-EPINEPHRINE (PF) 0.25% -1:200000 IJ SOLN
INTRAMUSCULAR | Status: AC
  Filled 2020-10-02: qty 30

## 2020-10-02 MED ORDER — PHENYLEPHRINE HCL (PRESSORS) 10 MG/ML IV SOLN
INTRAVENOUS | Status: DC | PRN
  Administered 2020-10-02: 50 ug via INTRAVENOUS
  Administered 2020-10-02: 100 ug via INTRAVENOUS
  Administered 2020-10-02: 50 ug via INTRAVENOUS

## 2020-10-02 MED ORDER — LIDOCAINE HCL (CARDIAC) PF 100 MG/5ML IV SOSY
PREFILLED_SYRINGE | INTRAVENOUS | Status: DC | PRN
  Administered 2020-10-02: 80 mg via INTRAVENOUS

## 2020-10-02 MED ORDER — CHLORHEXIDINE GLUCONATE 0.12 % MT SOLN
OROMUCOSAL | Status: AC
  Administered 2020-10-02: 15 mL via OROMUCOSAL
  Filled 2020-10-02: qty 15

## 2020-10-02 MED ORDER — OXYCODONE HCL 5 MG PO TABS
ORAL_TABLET | ORAL | Status: AC
  Filled 2020-10-02: qty 1

## 2020-10-02 MED ORDER — ORAL CARE MOUTH RINSE: 15.0000 mL | Freq: Once | OROMUCOSAL | Status: AC

## 2020-10-02 MED ORDER — FENTANYL CITRATE (PF) 100 MCG/2ML IJ SOLN
INTRAMUSCULAR | Status: DC | PRN
  Administered 2020-10-02: 50 ug via INTRAVENOUS
  Administered 2020-10-02 (×2): 25 ug via INTRAVENOUS

## 2020-10-02 MED ORDER — FENTANYL CITRATE (PF) 100 MCG/2ML IJ SOLN
INTRAMUSCULAR | Status: AC
  Filled 2020-10-02: qty 2

## 2020-10-02 MED ORDER — DEXAMETHASONE SODIUM PHOSPHATE 10 MG/ML IJ SOLN
INTRAMUSCULAR | Status: DC | PRN
  Administered 2020-10-02: 10 mg via INTRAVENOUS

## 2020-10-02 MED ORDER — ACETAMINOPHEN 500 MG PO TABS
ORAL_TABLET | ORAL | Status: AC
  Administered 2020-10-02: 1000 mg via ORAL
  Filled 2020-10-02: qty 2

## 2020-10-02 MED ORDER — IBUPROFEN 800 MG PO TABS: 800.0000 mg | ORAL_TABLET | Freq: Three times a day (TID) | ORAL | 0 refills | Status: DC | PRN

## 2020-10-02 MED ORDER — GABAPENTIN 300 MG PO CAPS
ORAL_CAPSULE | ORAL | Status: AC
  Administered 2020-10-02: 300 mg via ORAL
  Filled 2020-10-02: qty 1

## 2020-10-02 MED ORDER — LIDOCAINE HCL (PF) 2 % IJ SOLN
INTRAMUSCULAR | Status: AC
  Filled 2020-10-02: qty 5

## 2020-10-02 MED ORDER — CELECOXIB 200 MG PO CAPS
ORAL_CAPSULE | ORAL | Status: AC
  Administered 2020-10-02: 200 mg via ORAL
  Filled 2020-10-02: qty 1

## 2020-10-02 MED ORDER — FAMOTIDINE 20 MG PO TABS: 20.0000 mg | ORAL_TABLET | Freq: Once | ORAL | Status: AC

## 2020-10-02 MED ORDER — GABAPENTIN 300 MG PO CAPS: 300.0000 mg | ORAL_CAPSULE | ORAL | Status: AC

## 2020-10-02 MED ORDER — OXYCODONE HCL 5 MG/5ML PO SOLN: 5.0000 mg | Freq: Once | ORAL | Status: AC | PRN

## 2020-10-02 MED ORDER — EPHEDRINE SULFATE 50 MG/ML IJ SOLN
INTRAMUSCULAR | Status: DC | PRN
  Administered 2020-10-02: 5 mg via INTRAVENOUS

## 2020-10-02 MED ORDER — FENTANYL CITRATE (PF) 100 MCG/2ML IJ SOLN
INTRAMUSCULAR | Status: AC
  Administered 2020-10-02: 50 ug via INTRAVENOUS
  Filled 2020-10-02: qty 2

## 2020-10-02 MED ORDER — ONDANSETRON HCL 4 MG/2ML IJ SOLN
INTRAMUSCULAR | Status: AC
  Filled 2020-10-02: qty 2

## 2020-10-02 MED ORDER — CELECOXIB 200 MG PO CAPS: 200.0000 mg | ORAL_CAPSULE | ORAL | Status: AC

## 2020-10-02 MED ORDER — DEXMEDETOMIDINE (PRECEDEX) IN NS 20 MCG/5ML (4 MCG/ML) IV SYRINGE
PREFILLED_SYRINGE | INTRAVENOUS | Status: DC | PRN
  Administered 2020-10-02 (×2): 8 ug via INTRAVENOUS

## 2020-10-02 MED ORDER — LACTATED RINGERS IV SOLN: INTRAVENOUS | Status: DC

## 2020-10-02 MED ORDER — BUPIVACAINE LIPOSOME 1.3 % IJ SUSP: 20.0000 mL | Freq: Once | INTRAMUSCULAR | Status: DC

## 2020-10-02 MED ORDER — ONDANSETRON HCL 4 MG/2ML IJ SOLN
INTRAMUSCULAR | Status: DC | PRN
  Administered 2020-10-02: 4 mg via INTRAVENOUS

## 2020-10-02 MED ORDER — ACETAMINOPHEN 500 MG PO TABS
1000.0000 mg | ORAL_TABLET | ORAL | Status: AC
Start: 2020-10-02 — End: 2020-10-02

## 2020-10-02 MED ORDER — MEPERIDINE HCL 25 MG/ML IJ SOLN: 6.2500 mg | INTRAMUSCULAR | Status: DC | PRN

## 2020-10-02 MED ORDER — MIDAZOLAM HCL 2 MG/2ML IJ SOLN
INTRAMUSCULAR | Status: AC
  Filled 2020-10-02: qty 2

## 2020-10-02 MED ORDER — PROPOFOL 10 MG/ML IV BOLUS
INTRAVENOUS | Status: DC | PRN
  Administered 2020-10-02: 200 mg via INTRAVENOUS
  Administered 2020-10-02: 50 mg via INTRAVENOUS

## 2020-10-02 MED ORDER — CHLORHEXIDINE GLUCONATE 0.12 % MT SOLN: 15.0000 mL | Freq: Once | OROMUCOSAL | Status: AC

## 2020-10-02 SURGICAL SUPPLY — 28 items
BLADE CLIPPER SURG (BLADE) IMPLANT
BLADE SURG 15 STRL LF DISP TIS (BLADE) ×1 IMPLANT
BLADE SURG 15 STRL SS (BLADE) ×1
CANISTER SUCT 1200ML W/VALVE (MISCELLANEOUS) IMPLANT
CHLORAPREP W/TINT 26 (MISCELLANEOUS) ×2 IMPLANT
DECANTER SPIKE VIAL GLASS SM (MISCELLANEOUS) ×2 IMPLANT
DERMABOND ADVANCED (GAUZE/BANDAGES/DRESSINGS) ×1
DERMABOND ADVANCED .7 DNX12 (GAUZE/BANDAGES/DRESSINGS) ×1 IMPLANT
DRAPE LAPAROTOMY 77X122 PED (DRAPES) ×2 IMPLANT
ELECT CAUTERY BLADE 6.4 (BLADE) ×2 IMPLANT
ELECT REM PT RETURN 9FT ADLT (ELECTROSURGICAL) ×2
ELECTRODE REM PT RTRN 9FT ADLT (ELECTROSURGICAL) ×1 IMPLANT
GAUZE 4X4 16PLY ~~LOC~~+RFID DBL (SPONGE) ×2 IMPLANT
GLOVE SURG ORTHO LTX SZ7.5 (GLOVE) ×10 IMPLANT
GOWN STRL REUS W/ TWL LRG LVL3 (GOWN DISPOSABLE) ×3 IMPLANT
GOWN STRL REUS W/TWL LRG LVL3 (GOWN DISPOSABLE) ×3
KIT TURNOVER KIT A (KITS) ×2 IMPLANT
MANIFOLD NEPTUNE II (INSTRUMENTS) ×2 IMPLANT
NEEDLE HYPO 22GX1.5 SAFETY (NEEDLE) ×2 IMPLANT
NS IRRIG 500ML POUR BTL (IV SOLUTION) ×2 IMPLANT
PACK BASIN MINOR ARMC (MISCELLANEOUS) ×2 IMPLANT
SUT ETHIBOND 0 MO6 C/R (SUTURE) ×2 IMPLANT
SUT MNCRL 4-0 (SUTURE) ×1
SUT MNCRL 4-0 27XMFL (SUTURE) ×1
SUT VIC AB 3-0 SH 27 (SUTURE) ×1
SUT VIC AB 3-0 SH 27X BRD (SUTURE) ×1 IMPLANT
SUTURE MNCRL 4-0 27XMF (SUTURE) ×1 IMPLANT
SYR 10ML LL (SYRINGE) ×2 IMPLANT

## 2020-10-02 NOTE — Anesthesia Procedure Notes (Signed)
Procedure Name: LMA Insertion Date/Time: 10/02/2020 1:12 PM Performed by: Lorine Bears, RN Pre-anesthesia Checklist: Patient identified, Emergency Drugs available, Suction available, Patient being monitored and Timeout performed Patient Re-evaluated:Patient Re-evaluated prior to induction Oxygen Delivery Method: Circle system utilized Preoxygenation: Pre-oxygenation with 100% oxygen Induction Type: IV induction LMA: LMA inserted LMA Size: 5.0 Number of attempts: 1

## 2020-10-02 NOTE — Anesthesia Postprocedure Evaluation (Signed)
Anesthesia Post Note  Patient: Brian Saunders  Procedure(s) Performed: HERNIA REPAIR UMBILICAL ADULT, open, no mesh  Patient location during evaluation: PACU Anesthesia Type: General Level of consciousness: awake and alert Pain management: pain level controlled Vital Signs Assessment: post-procedure vital signs reviewed and stable Respiratory status: spontaneous breathing, nonlabored ventilation, respiratory function stable and patient connected to nasal cannula oxygen Cardiovascular status: blood pressure returned to baseline and stable Postop Assessment: no apparent nausea or vomiting Anesthetic complications: no   No notable events documented.   Last Vitals:  Vitals:   10/02/20 1406 10/02/20 1415  BP:  106/71  Pulse: 79 72  Resp: (!) 25 18  Temp:    SpO2: 97% 100%    Last Pain:  Vitals:   10/02/20 1400  TempSrc:   PainSc: Asleep                 Eliott Amparan Michae Kava

## 2020-10-02 NOTE — Interval H&P Note (Signed)
History and Physical Interval Note:  10/02/2020 12:45 PM  Brian Saunders  has presented today for surgery, with the diagnosis of umbilical hernia.  The various methods of treatment have been discussed with the patient and family. After consideration of risks, benefits and other options for treatment, the patient has consented to  Procedure(s): HERNIA REPAIR UMBILICAL ADULT, open, no mesh (N/A) as a surgical intervention.  The patient's history has been reviewed, patient examined, no change in status, stable for surgery.  I have reviewed the patient's chart and labs.  Questions were answered to the patient's satisfaction.     [name]

## 2020-10-02 NOTE — Op Note (Signed)
Umbilical Hernia Repair  Pre-operative Diagnosis: Umbilical hernia  Post-operative Diagnosis: same  Surgeon: [name], MD FACS  Anesthesia: General   Findings: Approximately 1 cm fascial defect diameter    Estimated Blood Loss: 5 mL                 Specimens: sac, preperitoneal adipose discarded.         Complications: none              Procedure Details  The patient was seen again in the Holding Room. The benefits, complications, treatment options, and expected outcomes were discussed with the patient. The risks of bleeding, infection, recurrence of symptoms, failure to resolve symptoms, bowel injury, mesh placement, mesh infection, any of which could require further surgery were reviewed with the patient. The likelihood of improving the patient's symptoms with return to their baseline status is good.  The patient and/or family concurred with the proposed plan, giving informed consent.  The patient was taken to Operating Room, identified, and the procedure verified.  A Time Out was held and the above information confirmed.  Prior to the induction of general anesthesia, antibiotic prophylaxis was administered. VTE prophylaxis was in place. General endotracheal anesthesia was then administered and tolerated well. After the induction, the abdomen was prepped with Chloraprep, a tap block was administered by anesthesia and then the abdomen was prepped again and draped in the sterile fashion. The patient was positioned in the supine position.  Incision was created with a scalpel over the hernia defect. Electrocautery was used to dissect through subcutaneous tissue, the hernia sac was opened excised. The hernia was measured. I closed the hernia defect with interrupted 0 Ethibond sutures.   Incision was closed in a with 3-0 Vicryl with interrupted dermal sutures. Dermabond was used to coat the skin. Patient tolerated procedure well and there were no immediate complications. Needle and  laparotomy counts were correct   [name], M.D., [facility]  10/02/2020 ; 1:53 PM

## 2020-10-02 NOTE — Addendum Note (Signed)
Addendum  created 10/02/20 1421 by Cathleen Corti, MD   Order list changed, Order sets accessed, Pharmacy for encounter modified

## 2020-10-02 NOTE — Progress Notes (Signed)
Patient was given Fentanyl for pain of 9/10. Oxygen dropped in the 80's. Asleep with snoring. Patient states 7-8/10. Given oxycodone 5mg  po. Patient states pain is still 9/10. Patient NAD, VSS, breathing stable. Spoke with anesthesia, no more medications at this time.

## 2020-10-02 NOTE — Discharge Instructions (Addendum)

## 2020-10-02 NOTE — Transfer of Care (Signed)
Immediate Anesthesia Transfer of Care Note  Patient: Brian Saunders  Procedure(s) Performed: HERNIA REPAIR UMBILICAL ADULT, open, no mesh  Patient Location: PACU  Anesthesia Type:General  Level of Consciousness: drowsy  Airway & Oxygen Therapy: Patient Spontanous Breathing and Patient connected to face mask oxygen  Post-op Assessment: Report given to RN and Post -op Vital signs reviewed and stable  Post vital signs: Reviewed and stable  Last Vitals:  Vitals Value Taken Time  BP 107/72 10/02/20 1400  Temp 36.1 C 10/02/20 1400  Pulse 77 10/02/20 1402  Resp 12 10/02/20 1402  SpO2 98 % 10/02/20 1402  Vitals shown include unvalidated device data.  Last Pain:  Vitals:   10/02/20 1400  TempSrc:   PainSc: Asleep      Patients Stated Pain Goal: 0 (10/02/20 1109)  Complications: No notable events documented.

## 2020-10-02 NOTE — Anesthesia Preprocedure Evaluation (Signed)
Anesthesia Evaluation  Patient identified by MRN, date of birth, ID band Patient awake    Reviewed: Allergy & Precautions, NPO status , Patient's Chart, lab work & pertinent test results  History of Anesthesia Complications Negative for: history of anesthetic complications  Airway Mallampati: I  TM Distance: >3 FB Neck ROM: Full    Dental no notable dental hx. (+) Teeth Intact, Missing   Pulmonary Current SmokerPatient did not abstain from smoking.,    Pulmonary exam normal        Cardiovascular Exercise Tolerance: Good Normal cardiovascular examI     Neuro/Psych    GI/Hepatic GERD  Medicated and Controlled,  Endo/Other    Renal/GU      Musculoskeletal   Abdominal Normal abdominal exam  (+)   Peds  Hematology   Anesthesia Other Findings   Reproductive/Obstetrics                             Anesthesia Physical Anesthesia Plan  ASA: 3  Anesthesia Plan: General   Post-op Pain Management: GA combined w/ Regional for post-op pain   Induction: Intravenous  PONV Risk Score and Plan: Ondansetron  Airway Management Planned: Oral ETT  Additional Equipment:   Intra-op Plan:   Post-operative Plan: Extubation in OR  Informed Consent: I have reviewed the patients History and Physical, chart, labs and discussed the procedure including the risks, benefits and alternatives for the proposed anesthesia with the patient or authorized representative who has indicated his/her understanding and acceptance.       Plan Discussed with: CRNA  Anesthesia Plan Comments: (I have consented for GA and then b/l  TAP block after induction)        Anesthesia Quick Evaluation

## 2020-10-03 ENCOUNTER — Encounter: Payer: Self-pay | Admitting: Surgery

## 2020-10-07 ENCOUNTER — Telehealth: Payer: Self-pay | Admitting: Surgery

## 2020-10-07 NOTE — Telephone Encounter (Signed)
Pt called w/potential concerns related to the umbilical hernia repair w/Dr. Claudine Mouton on 10/02/20.     > He states the bruising has turned yellow/green & has been assured this is the typical color staging of a bruise during the recovery period    > He states there has been some drainage (most significant after moving/turning/twisting) w/o color or odor & has been advised that as long as the drainage is minimal, clear (w/o color, texture, or odor) & no fever around the wound sites, this is the natural progression when healing from surgery.   > Pt is aware of his post-op on 8/25, where a return to work note will be provided; however, it is his responsibility to contact his employer to initiate any FMLA/STD claims or the delivery of the forms to the office for completion (fax # provided), as we do not automatically have forms in the office.  Abu acknowledged understanding & thanked.  He will reach out to his manager/supervisor for the necessary steps while he is out of work.

## 2020-10-17 ENCOUNTER — Ambulatory Visit (INDEPENDENT_AMBULATORY_CARE_PROVIDER_SITE_OTHER): Payer: Self-pay | Admitting: Physician Assistant

## 2020-10-17 ENCOUNTER — Other Ambulatory Visit: Payer: Self-pay

## 2020-10-17 ENCOUNTER — Encounter: Payer: Self-pay | Admitting: Physician Assistant

## 2020-10-17 VITALS — BP 122/86 | HR 101 | Temp 98.4°F | Ht 65.0 in | Wt 195.0 lb

## 2020-10-17 DIAGNOSIS — Z09 Encounter for follow-up examination after completed treatment for conditions other than malignant neoplasm: Secondary | ICD-10-CM

## 2020-10-17 DIAGNOSIS — K429 Umbilical hernia without obstruction or gangrene: Secondary | ICD-10-CM

## 2020-10-17 NOTE — Progress Notes (Signed)
[facility] POST-OP OFFICE VISIT  10/17/2020  HPI: Brian Saunders is a 35 y.o. male 15 days s/p umbilical hernia repair with Dr Claudine Mouton  He reports that he is doing well at home No reported issues with abdominal pain No fever, chills, nausea, emesis, or bowel changes He is tolerating PO without issues No issues with this incision  Vital signs: BP 122/86   Pulse (!) 101   Temp 98.4 F (36.9 C)   Ht 5\' 5"  (1.651 m)   Wt 195 lb (88.5 kg)   SpO2 99%   BMI 32.45 kg/m    Physical Exam: Constitutional: Well appearing male, NAD Abdomen: Soft, non-tender, non-distended, no rebound/guarding Skin: Umbilical incision is well healed, some expected induration, no erythema or drainage   Assessment/Plan: This is a 35 y.o. male 15 days s/p umbilical hernia repair with Dr 20   - Pain control prn; ibuprofen/tylenol  - Reviewed lifting restrictions; 6 weeks total; work note given  - Reviewed wound care  - He can follow up in clinic on as needed basis   -- Claudine Mouton, PA-C [facility] 10/17/2020, 10:17 AM [phone] M-F: 7am - 4pm

## 2020-10-17 NOTE — Patient Instructions (Addendum)
GENERAL POST-OPERATIVE PATIENT INSTRUCTIONS   WOUND CARE INSTRUCTIONS:  If the wound becomes bright red and painful or starts to drain infected material that is not clear, please contact your physician immediately.  If the wound is mildly pink and has a thick firm ridge underneath it, this is normal, and is referred to as a healing ridge.  This will resolve over the next 4-6 weeks.  BATHING: You may shower if you have been informed of this by your surgeon. However, Please do not submerge in a tub, hot tub, or pool until incisions are completely sealed or have been told by your surgeon that you may do so.  DIET:  You may eat any foods that you can tolerate.  It is a good idea to eat a high fiber diet and take in plenty of fluids to prevent constipation.  If you do become constipated you may want to take a mild laxative or take ducolax tablets on a daily basis until your bowel habits are regular.  Constipation can be very uncomfortable, along with straining, after recent surgery.  ACTIVITY:  You are encouraged to walk and engage in light activity for the next two weeks.  You should not lift more than 20 pounds for 6 weeks after surgery as it could put you at increased risk for complications.  Twenty pounds is roughly equivalent to a plastic bag of groceries. At that time- Listen to your body when lifting, if you have pain when lifting, stop and then try again in a few days. Soreness after doing exercises or activities of daily living is normal as you get back in to your normal routine.  MEDICATIONS:  Try to take narcotic medications and anti-inflammatory medications, such as tylenol, ibuprofen, naprosyn, etc., with food.  This will minimize stomach upset from the medication.  Should you develop nausea and vomiting from the pain medication, or develop a rash, please discontinue the medication and contact your physician.  You should not drive, make important decisions, or operate machinery when taking  narcotic pain medication.  SUNBLOCK Use sun block to incision area over the next year if this area will be exposed to sun. This helps decrease scarring and will allow you avoid a permanent darkened area over your incision.  QUESTIONS:  Please feel free to call our office if you have any questions, and we will be glad to assist you.

## 2021-03-05 ENCOUNTER — Emergency Department: Payer: Medicaid Other

## 2021-03-05 ENCOUNTER — Emergency Department
Admission: EM | Admit: 2021-03-05 | Discharge: 2021-03-05 | Disposition: A | Discharge status: Home / Self Care | Payer: Medicaid Other | Attending: Emergency Medicine | Admitting: Emergency Medicine

## 2021-03-05 ENCOUNTER — Encounter: Payer: Self-pay | Admitting: Emergency Medicine

## 2021-03-05 ENCOUNTER — Other Ambulatory Visit: Payer: Self-pay

## 2021-03-05 DIAGNOSIS — M25512 Pain in left shoulder: Secondary | ICD-10-CM | POA: Insufficient documentation

## 2021-03-05 MED ORDER — MELOXICAM 15 MG PO TABS: 15.0000 mg | ORAL_TABLET | Freq: Every day | ORAL | 2 refills | Status: AC

## 2021-03-05 NOTE — ED Triage Notes (Signed)
Pt comes into the ED via POV c/o left shoulder pain that started yesterday.  Pt denies any known injury to the shoulder but states the pain is worse when he moves in a lateral movement with the arm.  Pt in NAD at this time with no obvious deformity.

## 2021-03-05 NOTE — ED Provider Notes (Signed)
[facility] Provider Note    Event Date/Time   First MD Initiated Contact with Patient 03/05/21 1138     (approximate)   History   Shoulder Pain   HPI  Brian Saunders is a 36 y.o. male complains of left shoulder pain for about 3 months.  No known injury.  Patient works at [employer].  He is left-handed.  Patient states when he raises his arm up and down he hears a lot of popping in the joint.  No numbness or tingling      Physical Exam   Triage Vital Signs: ED Triage Vitals  Enc Vitals Group     BP 03/05/21 1213 138/78     Pulse Rate 03/05/21 1213 78     Resp 03/05/21 1213 20     Temp 03/05/21 1213 98 F (36.7 C)     Temp Source 03/05/21 1213 Oral     SpO2 03/05/21 1213 98 %     Weight 03/05/21 1105 195 lb 1.7 oz (88.5 kg)     Height 03/05/21 1105 5\' 5"  (1.651 m)     Head Circumference --      Peak Flow --      Pain Score 03/05/21 1105 8     Pain Loc --      Pain Edu? --      Excl. in [place]? --     Most recent vital signs: Vitals:   03/05/21 1213  BP: 138/78  Pulse: 78  Resp: 20  Temp: 98 F (36.7 C)  SpO2: 98%     General: Awake, no distress.   CV:  Good peripheral perfusion.   Resp:  Normal effort.   Abd:  No distention.   Other: Left shoulder is tender at the ACJ, popping sensation is reproduced with overhead reach and movement of the rotator cuff, patient does have good internal and external rotation I do not feel that he has rotator cuff tear   ED Results / Procedures / Treatments   Labs (all labs ordered are listed, but only abnormal results are displayed) Labs Reviewed - No data to display   EKG     RADIOLOGY  X-ray of the left shoulder   PROCEDURES:  Critical Care performed: No  Procedures   MEDICATIONS ORDERED IN ED: Medications - No data to display   IMPRESSION / MDM / ASSESSMENT AND PLAN / ED COURSE  I reviewed the triage vital signs and the nursing notes.                               Differential diagnosis includes, but is not limited to, left shoulder strain, rotator cuff tear, left shoulder separation, arthritis  Patient does repetitive movements at work, he is left-handed, pain has been ongoing for 3 months.  Does have a small amount of swelling noted at the ACJ and popping sensation is noted at the ACJ with movement of the left shoulder.  X-ray of the left shoulder does not show any acute bony abnormality, my review does not appear to be dislocated.  Confirmed by radiology that is a normal x-ray  I did place patient in a sling.  He was given a work note stating no use the left arm for 5 to 6 days.  Placed on meloxicam to use daily.  He is to follow-up with [facility].  He is to use ice on the shoulder.  Return emergency  department worsening.  Patient states he understands.  Is discharged stable condition.        FINAL CLINICAL IMPRESSION(S) / ED DIAGNOSES   Final diagnoses:  Acute pain of left shoulder     Rx / DC Orders   ED Discharge Orders          Ordered    meloxicam (MOBIC) 15 MG tablet  Daily        03/05/21 1214             Note:  This document was prepared using Dragon voice recognition software and may include unintentional dictation errors.    Faythe Ghee, PA-C 03/05/21 1218    Sharman Cheek, MD 03/05/21 1321

## 2021-03-05 NOTE — Discharge Instructions (Signed)
You have inflammation and swelling at your acj joint, wear the sling for 5 days, apply ice to the left shoulder Return to the ER if worsening

## 2022-04-17 ENCOUNTER — Emergency Department
Admission: EM | Admit: 2022-04-17 | Discharge: 2022-04-17 | Disposition: A | Discharge status: Home / Self Care | Payer: Medicaid Other | Attending: Emergency Medicine | Admitting: Emergency Medicine

## 2022-04-17 ENCOUNTER — Emergency Department: Payer: Medicaid Other

## 2022-04-17 DIAGNOSIS — R509 Fever, unspecified: Secondary | ICD-10-CM | POA: Diagnosis present

## 2022-04-17 DIAGNOSIS — J069 Acute upper respiratory infection, unspecified: Secondary | ICD-10-CM | POA: Insufficient documentation

## 2022-04-17 DIAGNOSIS — Z1152 Encounter for screening for COVID-19: Secondary | ICD-10-CM | POA: Insufficient documentation

## 2022-04-17 LAB — RESP PANEL BY RT-PCR (RSV, FLU A&B, COVID)  RVPGX2
Influenza A by PCR: NEGATIVE
Influenza B by PCR: NEGATIVE
Resp Syncytial Virus by PCR: NEGATIVE
SARS Coronavirus 2 by RT PCR: NEGATIVE

## 2022-04-17 MED ORDER — BENZONATATE 100 MG PO CAPS: ORAL_CAPSULE | ORAL | 0 refills | Status: DC

## 2022-04-17 MED ORDER — PSEUDOEPH-BROMPHEN-DM 30-2-10 MG/5ML PO SYRP: 5.0000 mL | ORAL_SOLUTION | Freq: Four times a day (QID) | ORAL | 0 refills | Status: DC | PRN

## 2022-04-17 NOTE — ED Notes (Signed)
Pt verbalizes understanding of discharge instructions. Opportunity for questioning and answers were provided. Pt discharged from ED to home with family.    

## 2022-04-17 NOTE — ED Provider Notes (Signed)
[facility] Emergency Department Provider Note     Event Date/Time   First MD Initiated Contact with Patient 04/17/22 1231     (approximate)   History   Cough and Fever   HPI  Tason Larmore is a 37 y.o. male with a history of sickle cell trait, GERD, and anxiety presents to the ED for evaluation of bodyaches and fevers.  Patient also reports associated cough and headache.  She does endorse sick contacts at home.  He had temperature prior to arrival at 100.6 F.  Took NyQuil prior to arrival.  Physical Exam   Triage Vital Signs: ED Triage Vitals  Enc Vitals Group     BP 04/17/22 1144 123/89     Pulse Rate 04/17/22 1144 (!) 120     Resp 04/17/22 1144 18     Temp 04/17/22 1144 99.1 F (37.3 C)     Temp Source 04/17/22 1144 Oral     SpO2 04/17/22 1144 93 %     Weight 04/17/22 1145 195 lb 1.7 oz (88.5 kg)     Height 04/17/22 1145 '5\' 5"'$  (1.651 m)     Head Circumference --      Peak Flow --      Pain Score 04/17/22 1144 8     Pain Loc --      Pain Edu? --      Excl. in [place]? --     Most recent vital signs: Vitals:   04/17/22 1241 04/17/22 1301  BP:  120/75  Pulse: 100 98  Resp:  17  Temp: 98.9 F (37.2 C)   SpO2:  98%    General Awake, no distress. NAD HEENT NCAT. PERRL. EOMI. No rhinorrhea. Mucous membranes are moist.  CV:  Good peripheral perfusion.  RESP:  Normal effort.  ABD:  No distention.    ED Results / Procedures / Treatments   Labs (all labs ordered are listed, but only abnormal results are displayed) Labs Reviewed  RESP PANEL BY RT-PCR (RSV, FLU A&B, COVID)  [id number]     EKG   RADIOLOGY  I personally viewed and evaluated these images as part of my medical decision making, as well as reviewing the written report by the radiologist.  ED Provider Interpretation: No acute findings  DG Chest 2 View  Result Date: 04/17/2022 CLINICAL DATA:  Cough, body aches, fever, headache EXAM: CHEST - 2 VIEW COMPARISON:   02/18/2020 FINDINGS: Normal heart size, mediastinal contours, and pulmonary vascularity. Lungs clear. No pulmonary infiltrate, pleural effusion, or pneumothorax. Pronounced biconvex thoracic scoliosis without acute osseous findings. IMPRESSION: Scoliosis. No acute abnormalities. Electronically Signed   By: Lavonia Dana M.D.   On: 04/17/2022 12:26     PROCEDURES:  Critical Care performed: No  Procedures   MEDICATIONS ORDERED IN ED: Medications - No data to display   IMPRESSION / MDM / [place] / ED COURSE  I reviewed the triage vital signs and the nursing notes.                              Differential diagnosis includes, but is not limited to, COVID, flu, RSV, viral URI, CAP, bronchitis  Patient's presentation is most consistent with acute complicated illness / injury requiring diagnostic workup.  Patient's diagnosis is consistent with URI with cough.  With a negative reassuring viral panel test at this time.  No x-ray evidence of any acute intrathoracic process,  based on my interpretation.  Patient will be discharged home with prescriptions for Tessalon Perles and Bromfed syrup. Patient is to follow up with primary provider or local community clinic as needed or otherwise directed. Patient is given ED precautions to return to the ED for any worsening or new symptoms.     FINAL CLINICAL IMPRESSION(S) / ED DIAGNOSES   Final diagnoses:  Viral URI with cough     Rx / DC Orders   ED Discharge Orders          Ordered    benzonatate (TESSALON PERLES) 100 MG capsule        04/17/22 1253    brompheniramine-pseudoephedrine-DM 30-2-10 MG/5ML syrup  4 times daily PRN        04/17/22 1253             Note:  This document was prepared using Dragon voice recognition software and may include unintentional dictation errors.    Melvenia Needles, PA-C 04/17/22 1706    Rada Hay, MD 04/17/22 1710

## 2022-04-17 NOTE — Discharge Instructions (Addendum)
Your viral panel test is negative and a chest x-ray does not show pneumonia.  Take the prescription meds as directed.  Take OTC Delsym for additional cough relief.  Take OTC Tylenol or Motrin for body aches and headache pain relief.  Follow-up with your primary provider or local community clinic for ongoing symptoms.  Please go to the following website to schedule new (and existing) patient appointments:   [email or web address]   The following is a list of primary care offices in the area who are accepting new patients at this time.  Please reach out to one of them directly and let them know you would like to schedule an appointment to follow up on an Emergency Department visit, and/or to establish a new primary care provider (PCP).  There are likely other primary care clinics in the are who are accepting new patients, but this is an excellent place to start:  [place] physician: Dr Lavon Paganini [address] [place], [place] [address] [phone]  [facility] Lead Physician: Dr Steele Sizer [address], [place], [place] [address] [phone]  [place] Physician: Dr Park Liter [address] [place], [place] [address] [phone]  [facility] Lead Physician: Dr Dewaine Oats [address] [place], [place] [address] [phone]  [facility] Physician: Dr Halina Maidens [address] [place], [place], [place] [address] [phone]

## 2022-04-17 NOTE — ED Triage Notes (Signed)
Pt presents to the ED via POV due to body aches and fevers. Pt states he also has a headache and cough. Pt does mention others are sick at home. Pt states he had a temperature of 100.6 took Nyquil around 1020am. Pt A&Ox4

## 2022-04-17 NOTE — ED Notes (Signed)
Pt reports cold like symptoms with generalized body aches, cough and fevers at home. Pt A&Ox4. Ambulatory without difficulty

## 2022-04-21 ENCOUNTER — Emergency Department: Payer: Medicaid Other

## 2022-04-21 ENCOUNTER — Encounter: Payer: Self-pay | Admitting: Emergency Medicine

## 2022-04-21 ENCOUNTER — Emergency Department
Admission: EM | Admit: 2022-04-21 | Discharge: 2022-04-21 | Disposition: A | Discharge status: Home / Self Care | Payer: Medicaid Other | Attending: Emergency Medicine | Admitting: Emergency Medicine

## 2022-04-21 DIAGNOSIS — R059 Cough, unspecified: Secondary | ICD-10-CM | POA: Diagnosis present

## 2022-04-21 DIAGNOSIS — Z1152 Encounter for screening for COVID-19: Secondary | ICD-10-CM | POA: Insufficient documentation

## 2022-04-21 DIAGNOSIS — J101 Influenza due to other identified influenza virus with other respiratory manifestations: Secondary | ICD-10-CM | POA: Insufficient documentation

## 2022-04-21 DIAGNOSIS — J069 Acute upper respiratory infection, unspecified: Secondary | ICD-10-CM

## 2022-04-21 LAB — RESP PANEL BY RT-PCR (RSV, FLU A&B, COVID)  RVPGX2
Influenza A by PCR: NEGATIVE
Influenza B by PCR: POSITIVE — AB
Resp Syncytial Virus by PCR: NEGATIVE
SARS Coronavirus 2 by RT PCR: NEGATIVE

## 2022-04-21 NOTE — Discharge Instructions (Signed)
Your x-ray does not show a pneumonia.  You may find the results of your COVID and flu swab on MyChart.  Please return for any new, worsening, or change in symptoms or other concerns.  It was a pleasure caring for you today.

## 2022-04-21 NOTE — ED Provider Notes (Signed)
[facility] Provider Note    Event Date/Time   First MD Initiated Contact with Patient 04/21/22 1338     (approximate)   History   Cough and Headache   HPI  Brian Saunders is a 37 y.o. male who presents today for evaluation of cough and headache for the past couple of days.  He also reports that things smell differently today which made him think that he had COVID.  He has not received any of the COVID vaccinations or the flu shot this year.  He denies chest pain or shortness of breath.  He thinks that he had a temperature yesterday but did not take his temperature today.  He has not taken any over-the-counter medications today.  He reports that his children have recently been sick with similar symptoms.  Patient Active Problem List   Diagnosis Date Noted   Diastasis recti 0000000   Umbilical hernia without obstruction and without gangrene 09/26/2020          Physical Exam   Triage Vital Signs: ED Triage Vitals  Enc Vitals Group     BP 04/21/22 1331 (!) 127/94     Pulse Rate 04/21/22 1331 (!) 109     Resp 04/21/22 1331 16     Temp 04/21/22 1331 98.4 F (36.9 C)     Temp Source 04/21/22 1331 Oral     SpO2 04/21/22 1331 96 %     Weight 04/21/22 1331 187 lb (84.8 kg)     Height 04/21/22 1331 '5\' 4"'$  (1.626 m)     Head Circumference --      Peak Flow --      Pain Score 04/21/22 1330 6     Pain Loc --      Pain Edu? --      Excl. in [place]? --     Most recent vital signs: Vitals:   04/21/22 1400 04/21/22 1432  BP:  (!) 118/93  Pulse:  (!) 106  Resp:  16  Temp:    SpO2: 95% 95%    Physical Exam Vitals and nursing note reviewed.  Constitutional:      General: Awake and alert. No acute distress.    Appearance: Normal appearance. The patient is normal weight.  HENT:     Head: Normocephalic and atraumatic.     Mouth: Mucous membranes are moist.  Eyes:     General: PERRL. Normal EOMs        Right eye: No discharge.        Left  eye: No discharge.     Conjunctiva/sclera: Conjunctivae normal.  Cardiovascular:     Rate and Rhythm: Normal rate and regular rhythm.     Pulses: Normal pulses.  Pulmonary:     Effort: Pulmonary effort is normal. No respiratory distress.     Breath sounds: Normal breath sounds.  Able to speak easily in complete sentences Abdominal:     Abdomen is soft. There is no abdominal tenderness. No rebound or guarding. No distention. Musculoskeletal:        General: No swelling. Normal range of motion.     Cervical back: Normal range of motion and neck supple.  Skin:    General: Skin is warm and dry.     Capillary Refill: Capillary refill takes less than 2 seconds.     Findings: No rash.  Neurological:     Mental Status: The patient is awake and alert.      ED Results /  Procedures / Treatments   Labs (all labs ordered are listed, but only abnormal results are displayed) Labs Reviewed  RESP PANEL BY RT-PCR (RSV, FLU A&B, COVID)  RVPGX2 - Abnormal; Notable for the following components:      Result Value   Influenza B by PCR POSITIVE (*)    All other components within normal limits     EKG     RADIOLOGY I independently reviewed and interpreted imaging and agree with radiologists findings.     PROCEDURES:  Critical Care performed:   Procedures   MEDICATIONS ORDERED IN ED: Medications - No data to display   IMPRESSION / MDM / [place] / ED COURSE  I reviewed the triage vital signs and the nursing notes.   Differential diagnosis includes, but is not limited to, COVID, influenza, RSV, other viral URI.  Patient is awake and alert, hemodynamically stable and afebrile.  He is in no acute distress.  He is able to speak easily in complete sentences and demonstrates no increased work of breathing.  Chest x-ray obtained demonstrates no acute cardiopulmonary abnormality.  Patient had a repeat swab, but did not wish to wait for the results.  He requested a call back with  the results.  I did call the patient and advised him that he has influenza B.  Discussed the option of Tamiflu, however symptoms been ongoing for approximately 5 days and prefers Tylenol/Motrin.  We discussed that he is highly contagious to others.  Discussed return precautions and outpatient follow-up.  Patient or stands and agrees with plan.  Discharged in stable condition.   Patient's presentation is most consistent with acute complicated illness / injury requiring diagnostic workup.   FINAL CLINICAL IMPRESSION(S) / ED DIAGNOSES   Final diagnoses:  Viral URI with cough  Influenza B     Rx / DC Orders   ED Discharge Orders     None        Note:  This document was prepared using Dragon voice recognition software and may include unintentional dictation errors.   [name] 04/21/22 1756    Lavonia Drafts, MD 04/25/22 [phone]

## 2022-04-21 NOTE — ED Triage Notes (Signed)
Pt seen here recently for URI, states he still feels bad and has cough. Endorses his taste if off.

## 2022-12-12 ENCOUNTER — Other Ambulatory Visit: Payer: Self-pay

## 2022-12-12 ENCOUNTER — Emergency Department
Admission: EM | Admit: 2022-12-12 | Discharge: 2022-12-12 | Disposition: A | Discharge status: Home / Self Care | Payer: BLUE CROSS/BLUE SHIELD | Attending: Emergency Medicine | Admitting: Emergency Medicine

## 2022-12-12 DIAGNOSIS — J069 Acute upper respiratory infection, unspecified: Secondary | ICD-10-CM | POA: Insufficient documentation

## 2022-12-12 DIAGNOSIS — Z1152 Encounter for screening for COVID-19: Secondary | ICD-10-CM | POA: Insufficient documentation

## 2022-12-12 DIAGNOSIS — R519 Headache, unspecified: Secondary | ICD-10-CM | POA: Diagnosis present

## 2022-12-12 LAB — RESP PANEL BY RT-PCR (RSV, FLU A&B, COVID)  RVPGX2
Influenza A by PCR: NEGATIVE
Influenza B by PCR: NEGATIVE
Resp Syncytial Virus by PCR: NEGATIVE
SARS Coronavirus 2 by RT PCR: NEGATIVE

## 2022-12-12 NOTE — ED Triage Notes (Signed)
Pt reports woke up this am and his head was hurting. Pt reports his son is sick with a runny nose and congestion as well. Pt denies cough or congestion for himself currently.

## 2022-12-12 NOTE — Discharge Instructions (Signed)
Follow-up with your primary care provider if any continued problems or go to [facility] urgent care as needed.  Increase fluids.  Tylenol or ibuprofen as needed for headache, body aches or fever.  Increase fluids.

## 2022-12-12 NOTE — ED Provider Notes (Signed)
[facility] Provider Note    Event Date/Time   First MD Initiated Contact with Patient 12/12/22 1013     (approximate)   History   Headache   HPI  Brian Saunders is a 37 y.o. male presents to the ED with complaint of nasal congestion and headache.  He is also here with his son who has similar symptoms.  Patient is unaware of any fever and has not taken any over-the-counter medication for his symptoms that started this morning.  Patient has a history of GERD, sickle cell trait and anxiety.     Physical Exam   Triage Vital Signs: ED Triage Vitals  Encounter Vitals Group     BP 12/12/22 1008 (!) 147/99     Systolic BP Percentile --      Diastolic BP Percentile --      Pulse Rate 12/12/22 1008 (!) 107     Resp 12/12/22 1008 17     Temp 12/12/22 1008 98.5 F (36.9 C)     Temp Source 12/12/22 1008 Oral     SpO2 12/12/22 1008 96 %     Weight 12/12/22 0959 187 lb 6.3 oz (85 kg)     Height 12/12/22 0959 5\' 4"  (1.626 m)     Head Circumference --      Peak Flow --      Pain Score 12/12/22 0959 7     Pain Loc --      Pain Education --      Exclude from Growth Chart --     Most recent vital signs: Vitals:   12/12/22 1008  BP: (!) 147/99  Pulse: (!) 107  Resp: 17  Temp: 98.5 F (36.9 C)  SpO2: 96%     General: Awake, no distress.  Alert, nontoxic in appearance. CV:  Good peripheral perfusion.  Heart regular rate and rhythm. Resp:  Normal effort.  Clear bilaterally. Abd:  No distention.  Other:  Minimal nasal congestion, posterior pharynx clear.  No cervical lymphadenopathy present.   ED Results / Procedures / Treatments   Labs (all labs ordered are listed, but only abnormal results are displayed) Labs Reviewed  RESP PANEL BY RT-PCR (RSV, FLU A&B, COVID)  RVPGX2      PROCEDURES:  Critical Care performed:   Procedures   MEDICATIONS ORDERED IN ED: Medications - No data to display   IMPRESSION / MDM / ASSESSMENT AND PLAN  / ED COURSE  I reviewed the triage vital signs and the nursing notes.   Differential diagnosis includes, but is not limited to, COVID, influenza, RSV, viral URI, seasonal allergies.  37 year old male presents to the ED with upper respiratory symptoms that started this morning along with his infant son who is also here with similar symptoms.  Patient was made aware that his respiratory panel was negative and reassured.  Patient is encouraged to take Tylenol, ibuprofen and drink lots of fluids.  He requested a note for work.      Patient's presentation is most consistent with acute complicated illness / injury requiring diagnostic workup.  FINAL CLINICAL IMPRESSION(S) / ED DIAGNOSES   Final diagnoses:  Viral URI     Rx / DC Orders   ED Discharge Orders     None        Note:  This document was prepared using Dragon voice recognition software and may include unintentional dictation errors.   Tommi Rumps, PA-C 12/12/22 1232    Jene Every, MD 12/12/22  1313  

## 2022-12-21 ENCOUNTER — Emergency Department
Admission: EM | Admit: 2022-12-21 | Discharge: 2022-12-21 | Disposition: A | Discharge status: Home / Self Care | Payer: BLUE CROSS/BLUE SHIELD | Attending: Emergency Medicine | Admitting: Emergency Medicine

## 2022-12-21 ENCOUNTER — Other Ambulatory Visit: Payer: Self-pay

## 2022-12-21 DIAGNOSIS — K0889 Other specified disorders of teeth and supporting structures: Secondary | ICD-10-CM | POA: Diagnosis present

## 2022-12-21 DIAGNOSIS — Z5321 Procedure and treatment not carried out due to patient leaving prior to being seen by health care provider: Secondary | ICD-10-CM | POA: Insufficient documentation

## 2022-12-21 NOTE — ED Triage Notes (Signed)
Pt here with dental pain x2 weeks. Pt states pain is all over and he his holding his face d/t the pain.

## 2022-12-22 ENCOUNTER — Emergency Department
Admission: EM | Admit: 2022-12-22 | Discharge: 2022-12-22 | Disposition: A | Discharge status: Home / Self Care | Payer: BLUE CROSS/BLUE SHIELD | Attending: Student in an Organized Health Care Education/Training Program | Admitting: Student in an Organized Health Care Education/Training Program

## 2022-12-22 DIAGNOSIS — K0889 Other specified disorders of teeth and supporting structures: Secondary | ICD-10-CM

## 2022-12-22 DIAGNOSIS — K029 Dental caries, unspecified: Secondary | ICD-10-CM | POA: Diagnosis not present

## 2022-12-22 MED ORDER — IBUPROFEN 600 MG PO TABS: 600.0000 mg | ORAL_TABLET | Freq: Four times a day (QID) | ORAL | 0 refills | Status: DC | PRN

## 2022-12-22 MED ORDER — AMOXICILLIN 875 MG PO TABS: 875.0000 mg | ORAL_TABLET | Freq: Two times a day (BID) | ORAL | 0 refills | Status: DC

## 2022-12-22 NOTE — ED Provider Notes (Signed)
[facility] Provider Note    Event Date/Time   First MD Initiated Contact with Patient 12/22/22 [id number]     (approximate)   History   Dental Pain   HPI  Brian Saunders is a 37 y.o. male   presents to the ED with plan of multiple dental problems including caries.  Patient states he is having difficulty finding a dentist due to his Medicaid.  No medications have been taken.      Physical Exam   Triage Vital Signs: ED Triage Vitals [12/22/22 0831]  Encounter Vitals Group     BP (!) 137/104     Systolic BP Percentile      Diastolic BP Percentile      Pulse Rate 96     Resp 16     Temp 98.2 F (36.8 C)     Temp src      SpO2 97 %     Weight 203 lb (92.1 kg)     Height 5\' 4"  (1.626 m)     Head Circumference      Peak Flow      Pain Score 8     Pain Loc      Pain Education      Exclude from Growth Chart     Most recent vital signs: Vitals:   12/22/22 0831  BP: (!) 137/104  Pulse: 96  Resp: 16  Temp: 98.2 F (36.8 C)  SpO2: 97%     General: Awake, no distress.  CV:  Good peripheral perfusion.  Resp:  Normal effort.  Abd:  No distention.  Other:  Patient has premolar and molar caries both left and right upper and lower areas.  Gums with minimal edema and no active drainage is noted.  Neck is supple without cervical lymphadenopathy.   ED Results / Procedures / Treatments   Labs (all labs ordered are listed, but only abnormal results are displayed) Labs Reviewed - No data to display    PROCEDURES:  Critical Care performed:   Procedures   MEDICATIONS ORDERED IN ED: Medications - No data to display   IMPRESSION / MDM / ASSESSMENT AND PLAN / ED COURSE  I reviewed the triage vital signs and the nursing notes.   Differential diagnosis includes, but is not limited to, dental carry, dental pain, dental abscess, gingivitis.  37 year old male presents to the ED with complaint of dental pain approximately 2 weeks.  Patient  states he is unable to find a dentist due to his Medicaid.  A prescription for amoxicillin was sent to the pharmacy for you to begin taking.  Also he was given list of dental clinics including the walk-in clinics at both [facility] and [place].      Patient's presentation is most consistent with acute, uncomplicated illness.  FINAL CLINICAL IMPRESSION(S) / ED DIAGNOSES   Final diagnoses:  Pain due to dental caries  Pain, dental     Rx / DC Orders   ED Discharge Orders          Ordered    amoxicillin (AMOXIL) 875 MG tablet  2 times daily        12/22/22 0903    ibuprofen (ADVIL) 600 MG tablet  Every 6 hours PRN        12/22/22 2130             Note:  This document was prepared using Dragon voice recognition software and may include unintentional dictation errors.  Tommi Rumps, PA-C 12/22/22 0915    Willy Eddy, MD 12/22/22 [phone]

## 2022-12-22 NOTE — ED Triage Notes (Signed)
PT arrives via POV. PT c/o ongoing dental pain for almost two weeks. PT AxOx4.

## 2022-12-22 NOTE — Discharge Instructions (Signed)
Follow-up with one of the dentist listed on your discharge papers and also with the list that is separate from your discharge papers to see if they take Medicaid.  A prescription was sent to the pharmacy for you to begin taking along with ibuprofen for inflammation.  OPTIONS FOR DENTAL FOLLOW UP CARE  [place] [facility] - Local Safety Net Dental Clinics [email or web address]   [facility] [phone])  [facility] [phone])  [place] [phone])  [facility] [phone])  [facility] [phone]) This clinic caters to the indigent population and is on a lottery system. Location: [employer] [facility] Dentistry, [employer], [address] [place] [facility] Hours: Wednesdays from 6pm - 9pm, patients seen by a lottery system. For dates, call or go to [email or web address] Services: Cleanings, fillings and simple extractions. Payment Options: DENTAL WORK IS FREE OF CHARGE. Bring proof of income or support. Best way to get seen: Arrive at 5:15 pm - this is a lottery, NOT first come/first serve, so arriving earlier will not increase your chances of being seen.     [facility] [phone] Select option 1 for emergencies   Location: [employer] [facility]istry, [place], [address], [place] Clinic Hours: No walk-ins accepted - call the day before to schedule an appointment. Check in times are 9:30 am and 1:30 pm. Services: Simple extractions, temporary fillings, pulpectomy/pulp debridement, uncomplicated abscess drainage. Payment Options: PAYMENT IS DUE AT THE TIME OF SERVICE.  Fee is usually $100-200, additional surgical procedures (e.g. abscess drainage) may be extra. Cash, checks, Visa/MasterCard accepted.  Can file Medicaid if patient is covered for dental - patient should  call case worker to check. No discount for [facility] patients. Best way to get seen: MUST call the day before and get onto the schedule. Can usually be seen the next 1-2 days. No walk-ins accepted.     [facility] [phone]   Location: [facility], [address], [place] [facility] Hours: M, W, Th, F 8am or 1:30pm, Tues 9a or 1:30 - first come/first served. Services: Simple extractions, temporary fillings, uncomplicated abscess drainage.  You do not need to be an [facility] resident. Payment Options: PAYMENT IS DUE AT THE TIME OF SERVICE. Dental insurance, otherwise sliding scale - bring proof of income or support. Depending on income and treatment needed, cost is usually $50-200. Best way to get seen: Arrive early as it is first come/first served.     [facility] [phone]   Location: [address] Clinic Hours: Mon-Thu 8a-5p Services: Most basic dental services including extractions and fillings. Payment Options: PAYMENT IS DUE AT THE TIME OF SERVICE. Sliding scale, up to 50% off - bring proof if income or support. Medicaid with dental option accepted. Best way to get seen: Call to schedule an appointment, can usually be seen within 2 weeks OR they will try to see walk-ins - show up at 8a or 2p (you may have to wait).     [facility] [phone] [place] RESIDENTS ONLY   Location: [facility], [address] [place], [place] [address] Clinic Hours: By appointment only. Monday - Thursday 8am-5pm, Friday 8am-12pm Services: Cleanings, fillings, extractions. Payment Options: PAYMENT IS DUE AT THE TIME OF SERVICE. Cash, Visa or MasterCard. Sliding scale - $30 minimum per service. Best way to get seen: Come in to office,  complete packet and make an appointment - need proof of income or support monies for each household member and  proof of [facility] residence. Usually takes about a month to get in.     [facility] [phone]   Location: [address] [facility] Clinic Hours: Walk-in Urgent Care Dental Services are offered Monday-Friday mornings only. The numbers of emergencies accepted daily is limited to the number of providers available. Maximum 15 - Mondays, Wednesdays & Thursdays Maximum 10 - Tuesdays & Fridays Services: You do not need to be a [facility] resident to be seen for a dental emergency. Emergencies are defined as pain, swelling, abnormal bleeding, or dental trauma. Walkins will receive x-rays if needed. NOTE: Dental cleaning is not an emergency. Payment Options: PAYMENT IS DUE AT THE TIME OF SERVICE. Minimum co-pay is $40.00 for uninsured patients. Minimum co-pay is $3.00 for Medicaid with dental coverage. Dental Insurance is accepted and must be presented at time of visit. Medicare does not cover dental. Forms of payment: Cash, credit card, checks. Best way to get seen: If not previously registered with the clinic, walk-in dental registration begins at 7:15 am and is on a first come/first serve basis. If previously registered with the clinic, call to make an appointment.     [facility] [phone] [place] RESIDENTS ONLY   Location: [address] [place], [place] Clinic Hours: Mon-Thu 10a-2p Services: Extractions only! Payment Options: FREE (donations accepted) - bring proof of income or support Best way to get seen: Call and schedule an appointment OR come at 8am on the 1st Monday of every month (except for holidays) when it is first come/first served.     [facility] [phone]   Location: [address] [place], [place] Clinic Hours: Friday mornings Services, Payment Options, Best way to get seen: Call for info

## 2023-09-23 ENCOUNTER — Emergency Department
Admission: EM | Admit: 2023-09-23 | Discharge: 2023-09-23 | Disposition: A | Discharge status: Home / Self Care | Attending: Emergency Medicine | Admitting: Emergency Medicine

## 2023-09-23 ENCOUNTER — Other Ambulatory Visit: Payer: Self-pay

## 2023-09-23 ENCOUNTER — Encounter: Payer: Self-pay | Admitting: Emergency Medicine

## 2023-09-23 DIAGNOSIS — H538 Other visual disturbances: Secondary | ICD-10-CM | POA: Insufficient documentation

## 2023-09-23 LAB — CBC
HCT: 41.2 % (ref 39.0–52.0)
Hemoglobin: 14.1 g/dL (ref 13.0–17.0)
MCH: 29 pg (ref 26.0–34.0)
MCHC: 34.2 g/dL (ref 30.0–36.0)
MCV: 84.6 fL (ref 80.0–100.0)
Platelets: 302 K/uL (ref 150–400)
RBC: 4.87 MIL/uL (ref 4.22–5.81)
RDW: 13.3 % (ref 11.5–15.5)
WBC: 6.3 K/uL (ref 4.0–10.5)
nRBC: 0 % (ref 0.0–0.2)

## 2023-09-23 LAB — COMPREHENSIVE METABOLIC PANEL WITH GFR
ALT: 31 U/L (ref 0–44)
AST: 31 U/L (ref 15–41)
Albumin: 4.1 g/dL (ref 3.5–5.0)
Alkaline Phosphatase: 73 U/L (ref 38–126)
Anion gap: 7 (ref 5–15)
BUN: 14 mg/dL (ref 6–20)
CO2: 24 mmol/L (ref 22–32)
Calcium: 9.4 mg/dL (ref 8.9–10.3)
Chloride: 106 mmol/L (ref 98–111)
Creatinine, Ser: 0.91 mg/dL (ref 0.61–1.24)
GFR, Estimated: >60 mL/min (ref >60)
Glucose, Bld: 114 mg/dL — ABNORMAL HIGH (ref 70–99)
Potassium: 4.1 mmol/L (ref 3.5–5.1)
Sodium: 137 mmol/L (ref 135–145)
Total Bilirubin: 0.5 mg/dL (ref 0.0–1.2)
Total Protein: 8.2 g/dL — ABNORMAL HIGH (ref 6.5–8.1)

## 2023-09-23 LAB — DIFFERENTIAL
Abs Immature Granulocytes: 0.03 K/uL (ref 0.00–0.07)
Basophils Absolute: 0.1 K/uL (ref 0.0–0.1)
Basophils Relative: 1 %
Eosinophils Absolute: 0.4 K/uL (ref 0.0–0.5)
Eosinophils Relative: 6 %
Immature Granulocytes: 1 %
Lymphocytes Relative: 31 %
Lymphs Abs: 2 K/uL (ref 0.7–4.0)
Monocytes Absolute: 0.7 K/uL (ref 0.1–1.0)
Monocytes Relative: 11 %
Neutro Abs: 3.2 K/uL (ref 1.7–7.7)
Neutrophils Relative %: 50 %

## 2023-09-23 LAB — PROTIME-INR
INR: 0.9 (ref 0.8–1.2)
Prothrombin Time: 13 s (ref 11.4–15.2)

## 2023-09-23 LAB — APTT: aPTT: 32 s (ref 24–36)

## 2023-09-23 LAB — ETHANOL: Alcohol, Ethyl (B): <15 mg/dL (ref <15)

## 2023-09-23 MED ORDER — FLUORESCEIN SODIUM 1 MG OP STRP
1.0000 | ORAL_STRIP | Freq: Once | OPHTHALMIC | Status: DC
  Filled 2023-09-23: qty 1

## 2023-09-23 NOTE — Discharge Instructions (Addendum)
 Go directly to their office to be evaluated for your eye.  If he feels like you need additional imaging he will send you back to the ER.  But hopefully they can figure this out at the eye doctor.  Return to the ER if develop worsening symptoms fevers or any other concerns  Address: [address] [place], [place] [address] Phone: [phone]

## 2023-09-23 NOTE — ED Provider Notes (Signed)
 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  Patient states that he woke up this morning with left blurred vision he went to bed around 12:30 AM without any symptoms.  He denies any pain, drainage, itching of the eye.  He denies wearing glasses or contacts.  Denies any flashers, floaters.  Denies any double vision.  Physical Exam   Triage Vital Signs: ED Triage Vitals  Encounter Vitals Group     BP 09/23/23 1055 (!) 151/97     Girls Systolic BP Percentile --      Girls Diastolic BP Percentile --      Boys Systolic BP Percentile --      Boys Diastolic BP Percentile --      Pulse Rate 09/23/23 1055 (!) 104     Resp 09/23/23 1055 18     Temp 09/23/23 1055 98.4 F (36.9 C)     Temp Source 09/23/23 1055 Oral     SpO2 09/23/23 1055 98 %     Weight 09/23/23 1053 211 lb (95.7 kg)     Height 09/23/23 1053 5' 6 (1.676 m)     Head Circumference --      Peak Flow --      Pain Score 09/23/23 1050 0     Pain Loc --      Pain Education --      Exclude from Growth Chart --     Most recent vital signs: Vitals:   09/23/23 1055 09/23/23 1311  BP: (!) 151/97 (!) 148/96  Pulse: (!) 104 94  Resp: 18 18  Temp: 98.4 F (36.9 C) 98.3 F (36.8 C)  SpO2: 98% 98%     General: Awake, no distress.  CV:  Good peripheral perfusion.  Resp:  Normal effort.  Abd:  No distention.  Other:  Decreased vision in the left 20/100 and 20/30 on the right.  Fluorescein  stain negative.  Extraocular movements intact.  Peripheral vision intact.  Unable to get pressure due to Tono-Pen not working but pupil is reactive bilaterally.  ED Results / Procedures / Treatments   Labs (all labs ordered are listed, but only abnormal results are displayed) Labs Reviewed   COMPREHENSIVE METABOLIC PANEL WITH GFR - Abnormal; Notable for the following components:      Result Value   Glucose, Bld 114 (*)    Total Protein 8.2 (*)    All other components within normal limits  PROTIME-INR  APTT  CBC  DIFFERENTIAL  ETHANOL  CBG MONITORING, ED    PROCEDURES:  Critical Care performed: No  Procedures   MEDICATIONS ORDERED IN ED: Medications  fluorescein  ophthalmic strip 1 strip (has no administration in time range)     IMPRESSION / MDM / ASSESSMENT AND PLAN / ED COURSE  I reviewed the triage vital signs and the nursing notes.   Patient's presentation is most consistent with acute, uncomplicated illness.   Patient comes in with onset of blurred vision.  Out of the window for stroke code symptoms have been going on for almost 12 hours.  No loss of vision, field loss, cranial nerve deficit on examination.  Labs ordered for triage without any evidence of EtOH abuse, normal white count CMP shows sugar  of 114.  Discussed case with Dr. Myrna from ophthalmology.  Was okay with holding off on CTA due to the above felt like it was unlikely to be positive and there would be more risk than benefits of obtaining at this time.  He did prefer to see patient in clinic and we will evaluate him there to see what is going on.  He will see patient at 130.  Patient does report that he will be able to go over there today and he understands that if Dr. Myrna feels like additional imaging is necessary that he may need to return to the ER he expressed understanding and felt comfortable with this plan       FINAL CLINICAL IMPRESSION(S) / ED DIAGNOSES   Final diagnoses:  Blurred vision, left eye     Rx / DC Orders   ED Discharge Orders     None        Note:  This document was prepared using Dragon voice recognition software and may include unintentional dictation errors.   Ernest Ronal BRAVO, MD 09/23/23 [phone]

## 2023-09-23 NOTE — ED Triage Notes (Signed)
 Pt via POV from home. Pt c/o L blurred vision when he woke up this AM. States she felt fine when he went to sleep last night and woke up with the vision changes. Sx have not subsided. Denies any eye drainage or pain. Pt is A&OX4 and NAD, ambulatory to triage with steady gait.

## 2023-12-15 ENCOUNTER — Emergency Department
Admission: EM | Admit: 2023-12-15 | Discharge: 2023-12-15 | Disposition: A | Discharge status: Home / Self Care | Attending: Emergency Medicine | Admitting: Emergency Medicine

## 2023-12-15 ENCOUNTER — Encounter: Payer: Self-pay | Admitting: Intensive Care

## 2023-12-15 ENCOUNTER — Other Ambulatory Visit: Payer: Self-pay

## 2023-12-15 DIAGNOSIS — R519 Headache, unspecified: Secondary | ICD-10-CM | POA: Diagnosis present

## 2023-12-15 DIAGNOSIS — R799 Abnormal finding of blood chemistry, unspecified: Secondary | ICD-10-CM | POA: Diagnosis not present

## 2023-12-15 DIAGNOSIS — F10939 Alcohol use, unspecified with withdrawal, unspecified: Secondary | ICD-10-CM | POA: Diagnosis not present

## 2023-12-15 DIAGNOSIS — D573 Sickle-cell trait: Secondary | ICD-10-CM | POA: Insufficient documentation

## 2023-12-15 DIAGNOSIS — Z9189 Other specified personal risk factors, not elsewhere classified: Secondary | ICD-10-CM

## 2023-12-15 HISTORY — DX: Depression, unspecified: F32.A

## 2023-12-15 HISTORY — DX: Pure hypercholesterolemia, unspecified: E78.00

## 2023-12-15 LAB — CBC WITH DIFFERENTIAL/PLATELET
Abs Immature Granulocytes: 0.03 K/uL (ref 0.00–0.07)
Basophils Absolute: 0 K/uL (ref 0.0–0.1)
Basophils Relative: 1 %
Eosinophils Absolute: 0.4 K/uL (ref 0.0–0.5)
Eosinophils Relative: 5 %
HCT: 42.5 % (ref 39.0–52.0)
Hemoglobin: 14.3 g/dL (ref 13.0–17.0)
Immature Granulocytes: 0 %
Lymphocytes Relative: 32 %
Lymphs Abs: 2.1 K/uL (ref 0.7–4.0)
MCH: 28.5 pg (ref 26.0–34.0)
MCHC: 33.6 g/dL (ref 30.0–36.0)
MCV: 84.7 fL (ref 80.0–100.0)
Monocytes Absolute: 0.5 K/uL (ref 0.1–1.0)
Monocytes Relative: 8 %
Neutro Abs: 3.7 K/uL (ref 1.7–7.7)
Neutrophils Relative %: 54 %
Platelets: 329 K/uL (ref 150–400)
RBC: 5.02 MIL/uL (ref 4.22–5.81)
RDW: 13.2 % (ref 11.5–15.5)
WBC: 6.8 K/uL (ref 4.0–10.5)
nRBC: 0 % (ref 0.0–0.2)

## 2023-12-15 LAB — COMPREHENSIVE METABOLIC PANEL WITH GFR
ALT: 25 U/L (ref 0–44)
AST: 24 U/L (ref 15–41)
Albumin: 4.3 g/dL (ref 3.5–5.0)
Alkaline Phosphatase: 64 U/L (ref 38–126)
Anion gap: 8 (ref 5–15)
BUN: 11 mg/dL (ref 6–20)
CO2: 25 mmol/L (ref 22–32)
Calcium: 9.5 mg/dL (ref 8.9–10.3)
Chloride: 104 mmol/L (ref 98–111)
Creatinine, Ser: 0.82 mg/dL (ref 0.61–1.24)
GFR, Estimated: >60 mL/min (ref >60)
Glucose, Bld: 104 mg/dL — ABNORMAL HIGH (ref 70–99)
Potassium: 4 mmol/L (ref 3.5–5.1)
Sodium: 137 mmol/L (ref 135–145)
Total Bilirubin: 0.5 mg/dL (ref 0.0–1.2)
Total Protein: 8.2 g/dL — ABNORMAL HIGH (ref 6.5–8.1)

## 2023-12-15 LAB — CBG MONITORING, ED: Glucose-Capillary: 107 mg/dL — ABNORMAL HIGH (ref 70–99)

## 2023-12-15 LAB — TSH: TSH: 0.926 u[IU]/mL (ref 0.350–4.500)

## 2023-12-15 LAB — T4, FREE: Free T4: 0.86 ng/dL (ref 0.61–1.12)

## 2023-12-15 MED ORDER — THIAMINE HCL 100 MG/ML IJ SOLN: 100.0000 mg | Freq: Every day | INTRAMUSCULAR | Status: DC

## 2023-12-15 MED ORDER — CHLORDIAZEPOXIDE HCL 25 MG PO CAPS: ORAL_CAPSULE | ORAL | 0 refills | Status: AC

## 2023-12-15 MED ORDER — LORAZEPAM 2 MG/ML IJ SOLN: 0.0000 mg | Freq: Two times a day (BID) | INTRAMUSCULAR | Status: DC

## 2023-12-15 MED ORDER — LORAZEPAM 1 MG PO TABS: 0.0000 mg | ORAL_TABLET | Freq: Two times a day (BID) | ORAL | Status: DC

## 2023-12-15 MED ORDER — LORAZEPAM 2 MG/ML IJ SOLN: 0.0000 mg | Freq: Four times a day (QID) | INTRAMUSCULAR | Status: DC

## 2023-12-15 MED ORDER — LORAZEPAM 1 MG PO TABS
0.0000 mg | ORAL_TABLET | Freq: Four times a day (QID) | ORAL | Status: DC
  Administered 2023-12-15: 1 mg via ORAL
  Filled 2023-12-15: qty 1

## 2023-12-15 MED ORDER — THIAMINE MONONITRATE 100 MG PO TABS
100.0000 mg | ORAL_TABLET | Freq: Every day | ORAL | Status: DC
  Administered 2023-12-15: 100 mg via ORAL
  Filled 2023-12-15: qty 1

## 2023-12-15 NOTE — ED Triage Notes (Signed)
 Patient c/o headache and tingling all over for three days.   Reports he is pre diabetic

## 2023-12-15 NOTE — ED Provider Notes (Signed)
 [facility] Emergency Department Provider Note     Event Date/Time   First MD Initiated Contact with Patient 12/15/23 1711     (approximate)   History   Tingling   HPI  Brian Saunders is a 38 y.o. male with a history of sickle cell trait, GERD, anxiety, depression, and EtOH use disorder presents to the ED with 3 to 4 days of intermittent headache.  Patient also describes some tingling all over.  He reports some tingling to his lips and throat but denies any difficulty breathing, swallowing, controlling oral secretions.  Patient also denies any known exposure, allergens, or allergic triggers.  No difficulty swallowing, no reports of any cough, chest pain, or shortness of breath.  Patient has been eating and drinking as expected.  He will report that he has DC'd drinking for the last 4 days, which is improved from his reports to begin a regular nightly drinker.  He denies any recent injury, trauma, or falls.  No weakness, paralysis, slurred speech, vision loss, tinnitus, vertigo, or syncope reported.  No reports of any cough, chills, fever, sweats, recent illness by the patient report.    Physical Exam   Triage Vital Signs: ED Triage Vitals  Encounter Vitals Group     BP 12/15/23 1538 (!) 123/105     Girls Systolic BP Percentile --      Girls Diastolic BP Percentile --      Boys Systolic BP Percentile --      Boys Diastolic BP Percentile --      Pulse Rate 12/15/23 1538 97     Resp 12/15/23 1538 16     Temp 12/15/23 1536 98.4 F (36.9 C)     Temp Source 12/15/23 1536 Oral     SpO2 12/15/23 1538 98 %     Weight 12/15/23 1536 185 lb (83.9 kg)     Height 12/15/23 1536 5' 6 (1.676 m)     Head Circumference --      Peak Flow --      Pain Score 12/15/23 1536 3     Pain Loc --      Pain Education --      Exclude from Growth Chart --     Most recent vital signs: Vitals:   12/15/23 1814 12/15/23 1941  BP: (!) 136/94 (!) 124/93  Pulse: 90 80   Resp:  18  Temp:    SpO2:  100%    General Awake, no distress. NAD HEENT NCAT. PERRL. EOMI. No rhinorrhea. Mucous membranes are moist.  CV:  Good peripheral perfusion. RRR RESP:  Normal effort. CTA ABD:  No distention.  MSK:  AROM of all extremities NEURO: Cranial nerves II to XII grossly intact   ED Results / Procedures / Treatments   Labs (all labs ordered are listed, but only abnormal results are displayed) Labs Reviewed  COMPREHENSIVE METABOLIC PANEL WITH GFR - Abnormal; Notable for the following components:      Result Value   Glucose, Bld 104 (*)    Total Protein 8.2 (*)    All other components within normal limits  CBG MONITORING, ED - Abnormal; Notable for the following components:   Glucose-Capillary 107 (*)    All other components within normal limits  CBC WITH DIFFERENTIAL/PLATELET  TSH  T4, FREE  CBG MONITORING, ED     EKG   RADIOLOGY   No results found.   PROCEDURES:  Critical Care performed: No  Procedures  MEDICATIONS ORDERED IN ED: Medications  LORazepam (ATIVAN) injection 0-4 mg ( Intravenous See Alternative 12/15/23 1854)    Or  LORazepam (ATIVAN) tablet 0-4 mg (1 mg Oral Given 12/15/23 1854)  LORazepam (ATIVAN) injection 0-4 mg (has no administration in time range)    Or  LORazepam (ATIVAN) tablet 0-4 mg (has no administration in time range)  thiamine (VITAMIN B1) tablet 100 mg (100 mg Oral Given 12/15/23 1854)     IMPRESSION / MDM / ASSESSMENT AND PLAN / ED COURSE  I reviewed the triage vital signs and the nursing notes.                              Differential diagnosis includes, but is not limited to, electrolyte abnormality, hypoglycemia, DTs  Patient's presentation is most consistent with acute complicated illness / injury requiring diagnostic workup.  Patient's diagnosis is consistent with alcohol withdrawal symptoms without evidence of acute DTs or seizures.  Patient presenting with some subjective complaints of  generalized tingling all over his body.  Patient with no acute respiratory stress, dehydration, toxic appearance.  Labs are reassuring with no acute abnormalities appreciated.  No evidence of concern for angioedema.  Patient endorses his last drink was approximately 4 days ago, with a history of regular nightly drinking, to help with sleep induction.  Patient stable at this time with no acute distress.  His CIWA score was 8, consistent with patient likely acute detox from alcohol use disorder.  Patient was treated in the ED with a dose of Ativan, and was discharged to the care of his significant other.  Patient voiced his intention to continue with alcohol cessation.  I discussed options for support including medication management with chlordiazepoxide.  Patient was agreeable to the medication course and a prescription was provided at his request. Patient is to follow up with his PCP as needed or otherwise directed. Patient is given ED precautions to return to the ED for any worsening or new symptoms.   FINAL CLINICAL IMPRESSION(S) / ED DIAGNOSES   Final diagnoses:  At risk for alcohol withdrawal     Rx / DC Orders   ED Discharge Orders          Ordered    chlordiazePOXIDE (LIBRIUM) 25 MG capsule  Multiple Frequencies        12/15/23 1854             Note:  This document was prepared using Dragon voice recognition software and may include unintentional dictation errors.    Loyd Candida LULLA Aldona, PA-C 12/15/23 1954    Jacolyn Pae, MD 12/17/23 1323

## 2023-12-15 NOTE — Discharge Instructions (Addendum)
 Your labs and exam overall reassuring and within normal limits.  Your symptoms may be due to your decreased alcohol intake.  I hope that you will continue to move towards stopping all alcohol consumption.  Take the prescription medication to help with your symptoms as directed.  You may consider holding your sertraline and hydroxyzine for the next 2 to 3 days.  Follow-up with your primary provider for ongoing evaluation.  Return to the ED if needed.

## 2024-01-05 ENCOUNTER — Emergency Department
Admission: EM | Admit: 2024-01-05 | Discharge: 2024-01-05 | Disposition: A | Discharge status: Home / Self Care | Attending: Emergency Medicine | Admitting: Emergency Medicine

## 2024-01-05 ENCOUNTER — Other Ambulatory Visit: Payer: Self-pay

## 2024-01-05 DIAGNOSIS — R0981 Nasal congestion: Secondary | ICD-10-CM | POA: Diagnosis not present

## 2024-01-05 DIAGNOSIS — R051 Acute cough: Secondary | ICD-10-CM | POA: Insufficient documentation

## 2024-01-05 DIAGNOSIS — R059 Cough, unspecified: Secondary | ICD-10-CM | POA: Diagnosis present

## 2024-01-05 LAB — RESP PANEL BY RT-PCR (RSV, FLU A&B, COVID)  RVPGX2
Influenza A by PCR: NEGATIVE
Influenza B by PCR: NEGATIVE
Resp Syncytial Virus by PCR: NEGATIVE
SARS Coronavirus 2 by RT PCR: NEGATIVE

## 2024-01-05 MED ORDER — BENZONATATE 100 MG PO CAPS: 100.0000 mg | ORAL_CAPSULE | Freq: Three times a day (TID) | ORAL | 0 refills | Status: AC | PRN

## 2024-01-05 NOTE — ED Triage Notes (Signed)
 Patient states cough, headache and congestion x 2 days.

## 2024-01-05 NOTE — Discharge Instructions (Signed)
 You were seen in the emergency department today for a cough. Your respiratory panel which includes COVID, influenza and RSV were negative.   A viral cough may last up to 2-3 weeks.   Take tylenol or ibuprofen for pain or fever as directed.   Stay hydrated by drinking plenty of fluids to thin mucus. Get adequate amount of sleep and avoid overexertion. Consider a humidifier at night. Warm teas and a spoonful of honey may help reduce cough frequency. Follow up with your primary care provider as needed.

## 2024-01-05 NOTE — ED Provider Notes (Signed)
 [facility] Emergency Department Provider Note     Event Date/Time   First MD Initiated Contact with Patient 01/05/24 1749     (approximate)   History   Cough   HPI  Brian Saunders is a 38 y.o. male with a past medical history of GERD, anxiety and sickle cell trait presents to the ED for evaluation of a cough and nasal congestion x 2 days.  Patient endorses sick contacts amongst his son at home with similar symptoms.  Denies fever, chest pain and shortness of breath.  He has tried over-the-counter DayQuil and NyQuil with some relief.     Physical Exam   Triage Vital Signs: ED Triage Vitals  Encounter Vitals Group     BP 01/05/24 1547 (!) 140/108     Girls Systolic BP Percentile --      Girls Diastolic BP Percentile --      Boys Systolic BP Percentile --      Boys Diastolic BP Percentile --      Pulse Rate 01/05/24 1547 (!) 108     Resp 01/05/24 1547 18     Temp 01/05/24 1547 98.2 F (36.8 C)     Temp Source 01/05/24 1547 Oral     SpO2 01/05/24 1547 94 %     Weight --      Height --      Head Circumference --      Peak Flow --      Pain Score 01/05/24 1749 0     Pain Loc --      Pain Education --      Exclude from Growth Chart --     Most recent vital signs: Vitals:   01/05/24 1547  BP: (!) 140/108  Pulse: (!) 108  Resp: 18  Temp: 98.2 F (36.8 C)  SpO2: 94%    General Awake, no distress.  Well-appearing HEENT NCAT.  CV:  Good peripheral perfusion.  RRR RESP:  Normal effort.  LCTAB ABD:  No distention.   ED Results / Procedures / Treatments   Labs (all labs ordered are listed, but only abnormal results are displayed) Labs Reviewed  RESP PANEL BY RT-PCR (RSV, FLU A&B, COVID)  [id number]    No results found.  PROCEDURES:  Critical Care performed: No  Procedures   MEDICATIONS ORDERED IN ED: Medications - No data to display   IMPRESSION / MDM / ASSESSMENT AND PLAN / ED COURSE  I reviewed the triage vital  signs and the nursing notes.                               38 y.o. male presents to the emergency department for evaluation and treatment of cough x 2 days. See HPI for further details.   Differential diagnosis includes, but is not limited to viral URI, sinusitis, COVID-19  Patient's presentation is most consistent with acute complicated illness / injury requiring diagnostic workup.  Patient is alert and oriented.  Noted elevated initial vital signs with pulse rate of 108 and BP of 140/108 and which I suspect due to viral etiology.  Patient is afebrile.  Physical exam findings are stated above and normal lung exam.  Low suspicion for PNA given duration of symptoms.  Respiratory panel is negative.  Education on symptomatic care provided to patient.  Will prescribe Tessalon  pearls.  ED return precaution discussed.  He is in stable condition for discharge  home.  FINAL CLINICAL IMPRESSION(S) / ED DIAGNOSES   Final diagnoses:  Acute cough   Rx / DC Orders   ED Discharge Orders          Ordered    benzonatate  (TESSALON ) 100 MG capsule  3 times daily PRN        01/05/24 1807             Note:  This document was prepared using Dragon voice recognition software and may include unintentional dictation errors.    Margrette, Korrine Sicard A, PA-C 01/05/24 2249    Dorothyann Drivers, MD 01/06/24 2253

## 2024-01-17 ENCOUNTER — Encounter: Payer: Self-pay | Admitting: Emergency Medicine

## 2024-01-17 ENCOUNTER — Emergency Department

## 2024-01-17 ENCOUNTER — Other Ambulatory Visit: Payer: Self-pay

## 2024-01-17 DIAGNOSIS — M25511 Pain in right shoulder: Secondary | ICD-10-CM | POA: Diagnosis present

## 2024-01-17 DIAGNOSIS — Z5321 Procedure and treatment not carried out due to patient leaving prior to being seen by health care provider: Secondary | ICD-10-CM | POA: Diagnosis not present

## 2024-01-17 NOTE — ED Triage Notes (Signed)
 Pt arrives POV ambulatory to triage, gait steady, no acute distress noted c/o right shoulder pain that started 1 hour pta while playing video game, denies injury. Pt states pain radiates from shoulder down arm and into neck. Pt tearful in triage.

## 2024-01-18 ENCOUNTER — Emergency Department
Admission: EM | Admit: 2024-01-18 | Discharge: 2024-01-18 | Discharge status: Against Medical Advice | Attending: Emergency Medicine | Admitting: Emergency Medicine

## 2024-01-18 MED ORDER — IBUPROFEN 800 MG PO TABS: 800.0000 mg | ORAL_TABLET | Freq: Once | ORAL | Status: DC

## 2024-01-18 MED ORDER — METHOCARBAMOL 500 MG PO TABS: 500.0000 mg | ORAL_TABLET | Freq: Once | ORAL | Status: DC

## 2024-02-21 ENCOUNTER — Emergency Department
Admission: EM | Admit: 2024-02-21 | Discharge: 2024-02-21 | Disposition: A | Discharge status: Home / Self Care | Attending: Emergency Medicine | Admitting: Emergency Medicine

## 2024-02-21 DIAGNOSIS — L0501 Pilonidal cyst with abscess: Secondary | ICD-10-CM | POA: Diagnosis present

## 2024-02-21 MED ORDER — DOXYCYCLINE HYCLATE 100 MG PO TABS: 100.0000 mg | ORAL_TABLET | Freq: Two times a day (BID) | ORAL | 0 refills | Status: DC

## 2024-02-21 MED ORDER — TRAMADOL HCL 50 MG PO TABS: 50.0000 mg | ORAL_TABLET | Freq: Four times a day (QID) | ORAL | 0 refills | Status: AC | PRN

## 2024-02-21 NOTE — Discharge Instructions (Signed)
 Follow-up with your regular doctor if not improving in 3-4 worsening.  Take medication as prescribed.

## 2024-02-21 NOTE — ED Triage Notes (Signed)
 Pt presents to the ED via POV from home. Pt reports noticing a hardened area under his coccyx bone yesterday. Reports trying to squeeze it yesterday and states that today the pain is worse. Pt extremely uncomfortable at time of triage. Denies fevers or chills.

## 2024-02-21 NOTE — ED Provider Notes (Signed)
 "  [facility] Provider Note    Event Date/Time   First MD Initiated Contact with Patient 02/21/24 1339     (approximate)   History   Abscess   HPI  Brian Saunders is a 38 y.o. male that history of scoliosis, high cholesterol, GERD, EtOH abuse, presents emergency department with complaints of a hardened area on coccyx that he noted yesterday.  States he tried to squeeze it but it became more painful.  Today pain is worse.  States uncomfortable to sit.  No fever or chills.  No drainage from the area.  Never had 1 in this area before      Physical Exam   Triage Vital Signs: ED Triage Vitals  Encounter Vitals Group     BP 02/21/24 1216 (!) 156/103     Girls Systolic BP Percentile --      Girls Diastolic BP Percentile --      Boys Systolic BP Percentile --      Boys Diastolic BP Percentile --      Pulse Rate 02/21/24 1216 (!) 120     Resp 02/21/24 1216 18     Temp 02/21/24 1216 98.7 F (37.1 C)     Temp Source 02/21/24 1216 Oral     SpO2 02/21/24 1216 97 %     Weight 02/21/24 1217 203 lb (92.1 kg)     Height 02/21/24 1217 5' 6 (1.676 m)     Head Circumference --      Peak Flow --      Pain Score 02/21/24 1216 7     Pain Loc --      Pain Education --      Exclude from Growth Chart --     Most recent vital signs: Vitals:   02/21/24 1216  BP: (!) 156/103  Pulse: (!) 120  Resp: 18  Temp: 98.7 F (37.1 C)  SpO2: 97%     General: Awake, no distress.   CV:  Good peripheral perfusion. Resp:  Normal effort. Abd:  No distention.   Other:  Hard indurated area noted at the pilonidal area.  No fluctuance noted.  No redness noted.  Neurovascular intact   ED Results / Procedures / Treatments   Labs (all labs ordered are listed, but only abnormal results are displayed) Labs Reviewed - No data to display   EKG     RADIOLOGY     PROCEDURES:   Procedures  Critical Care:  no Chief Complaint  Patient presents with   Abscess       MEDICATIONS ORDERED IN ED: Medications - No data to display   IMPRESSION / MDM / ASSESSMENT AND PLAN / ED COURSE  I reviewed the triage vital signs and the nursing notes.                              Differential diagnosis includes, but is not limited to, pilonidal cyst, pilonidal abscess, cellulitis  Patient's presentation is most consistent with acute illness / injury with system symptoms.   Physical exam is consistent with a pilonidal abscess that is not currently ready to be drained.  I did explain this to the patient.  He is to soak in a tub of warm water with Epsom salts.  Started him on doxycycline  and tramadol  for pain if needed.  He can also take Tylenol  and ibuprofen .  He is in agreement treatment plan.  Strict instructions  to return if worsening.  Discharged stable condition.      FINAL CLINICAL IMPRESSION(S) / ED DIAGNOSES   Final diagnoses:  Pilonidal abscess     Rx / DC Orders   ED Discharge Orders          Ordered    doxycycline  (VIBRA -TABS) 100 MG tablet  2 times daily        02/21/24 1347    traMADol  (ULTRAM ) 50 MG tablet  Every 6 hours PRN        02/21/24 1347             Note:  This document was prepared using Dragon voice recognition software and may include unintentional dictation errors.    Gasper Devere ORN, PA-C 02/21/24 1640    Arlander Charleston, MD 02/21/24 2025  "

## 2024-02-25 ENCOUNTER — Emergency Department: Admission: EM | Admit: 2024-02-25 | Discharge: 2024-02-25 | Disposition: A | Discharge status: Home / Self Care

## 2024-02-25 ENCOUNTER — Other Ambulatory Visit: Payer: Self-pay

## 2024-02-25 DIAGNOSIS — L0591 Pilonidal cyst without abscess: Secondary | ICD-10-CM | POA: Insufficient documentation

## 2024-02-25 DIAGNOSIS — L0231 Cutaneous abscess of buttock: Secondary | ICD-10-CM | POA: Diagnosis present

## 2024-02-25 MED ORDER — CLINDAMYCIN HCL 300 MG PO CAPS: 300.0000 mg | ORAL_CAPSULE | Freq: Three times a day (TID) | ORAL | 0 refills | Status: AC

## 2024-02-25 NOTE — ED Provider Notes (Signed)
" ° °  [facility] Provider Note    Event Date/Time   First MD Initiated Contact with Patient 02/25/24 2004     (approximate)   History   Abscess   HPI  Brian Saunders is a 39 y.o. male with history of sickle cell trait, alcohol abuse, anxiety, depression, hyperlipidemia and as listed in EMR presents to the emergency department for treatment and evaluation of abscess to the buttocks beside of his tailbone.  He was evaluated here for the same on 02/21/2024 but is not improving.  No fever..     Physical Exam    Vitals:   02/25/24 1749  BP: (!) 146/95  Pulse: (!) 121  Resp: 16  Temp: 98.9 F (37.2 C)  SpO2: 97%    General: Awake, no distress.  CV:  Good peripheral perfusion.  Resp:  Normal effort.  Abd:  No distention.  Other:  .  Patient reports that it is tender is the right side of the natal cleft however there is no area of fluctuance or erythema.  No raised area.   ED Results / Procedures / Treatments   Labs (all labs ordered are listed, but only abnormal results are displayed)  Labs Reviewed - No data to display   EKG  Not indicated   RADIOLOGY  Image and radiology report reviewed and interpreted by me. Radiology report consistent with the same.  Not indicated  PROCEDURES:  Critical Care performed: No  Procedures   MEDICATIONS ORDERED IN ED:  Medications - No data to display   IMPRESSION / MDM / ASSESSMENT AND PLAN / ED COURSE    Differential diagnosis includes, but is not limited to, pilonidal cyst, pilonidal abscess  Patient's presentation is most consistent with acute illness / injury with system symptoms.  39 year old male presenting to the emergency department for treatment and evaluation of pain in the area of his tailbone.  On exam, there is no raised, fluctuant, erythematous area.  The area of concern is focal.  Pain does not extend around the anus or into the perineum/testicle area.  Plan will be to  change the antibiotic from doxycycline  to clindamycin  and have him call primary care/surgical consult if pain does not improve.      FINAL CLINICAL IMPRESSION(S) / ED DIAGNOSES   Final diagnoses:  Pilonidal cyst     Rx / DC Orders   ED Discharge Orders          Ordered    clindamycin  (CLEOCIN ) 300 MG capsule  3 times daily        02/25/24 2032             Note:  This document was prepared using Dragon voice recognition software and may include unintentional dictation errors.   Herlinda Kirk NOVAK, FNP 02/25/24 2230    Clarine Ozell LABOR, MD 02/26/24 0016  "

## 2024-02-25 NOTE — Discharge Instructions (Signed)
 Please call and schedule follow-up appointment with the surgeon.  Take the clindamycin instead of the doxycycline .  If symptoms change or worsen and you are unable to see your primary care provider or the surgeon, please return to the emergency department.

## 2024-02-25 NOTE — ED Triage Notes (Signed)
 Pt arrives via POV with c/o an abscess on their coccyx bone that they were treated for here earlier this week. Pt has been taking prescribed abx and pain meds as directed. Pt reports cyst/abscess is swollen and painful. Pt is A&Ox4 during triage.

## 2024-02-25 NOTE — ED Provider Triage Note (Signed)
 Emergency Medicine Provider Triage Evaluation Note  Brian Saunders , a 39 y.o. male  was evaluated in triage.  Pt complains of painful abscess to buttock. Already on antibiotic. Pain getting worse. No fever..  Physical Exam  BP (!) 146/95 (BP Location: Right Arm)   Pulse (!) 121   Temp 98.9 F (37.2 C) (Oral)   Resp 16   Ht 5' 6 (1.676 m)   Wt 92.1 kg   SpO2 97%   BMI 32.77 kg/m  Gen:   Awake, no distress   Resp:  Normal effort  MSK:   Moves extremities without difficulty  Other:    Medical Decision Making  Medically screening exam initiated at 6:12 PM.  Appropriate orders placed.  Brian Saunders was informed that the remainder of the evaluation will be completed by another provider, this initial triage assessment does not replace that evaluation, and the importance of remaining in the ED until their evaluation is complete.    Herlinda Kirk NOVAK, FNP 02/25/24 1813

## 2024-03-12 ENCOUNTER — Other Ambulatory Visit: Payer: Self-pay

## 2024-03-12 ENCOUNTER — Emergency Department
Admission: EM | Admit: 2024-03-12 | Discharge: 2024-03-12 | Disposition: A | Discharge status: Home / Self Care | Attending: Emergency Medicine | Admitting: Emergency Medicine

## 2024-03-12 DIAGNOSIS — L0501 Pilonidal cyst with abscess: Secondary | ICD-10-CM | POA: Diagnosis present

## 2024-03-12 MED ORDER — LIDOCAINE-PRILOCAINE 2.5-2.5 % EX CREA
TOPICAL_CREAM | Freq: Once | CUTANEOUS | Status: AC
  Filled 2024-03-12: qty 5

## 2024-03-12 MED ORDER — OXYCODONE-ACETAMINOPHEN 5-325 MG PO TABS: 1.0000 | ORAL_TABLET | Freq: Four times a day (QID) | ORAL | 0 refills | Status: AC | PRN

## 2024-03-12 MED ORDER — LIDOCAINE-PRILOCAINE 2.5-2.5 % EX CREA
1.0000 | TOPICAL_CREAM | CUTANEOUS | Status: DC
  Administered 2024-03-12: 1

## 2024-03-12 MED ORDER — CLINDAMYCIN HCL 300 MG PO CAPS: 300.0000 mg | ORAL_CAPSULE | Freq: Three times a day (TID) | ORAL | 0 refills | Status: AC

## 2024-03-12 MED ORDER — OXYCODONE-ACETAMINOPHEN 5-325 MG PO TABS
1.0000 | ORAL_TABLET | Freq: Once | ORAL | Status: AC
  Administered 2024-03-12: 1 via ORAL
  Filled 2024-03-12: qty 1

## 2024-03-12 MED ORDER — LIDOCAINE HCL (PF) 1 % IJ SOLN
3.0000 mL | INTRAMUSCULAR | Status: DC
  Administered 2024-03-12: 3 mL

## 2024-03-12 MED ORDER — LIDOCAINE HCL (PF) 1 % IJ SOLN
5.0000 mL | Freq: Once | INTRAMUSCULAR | Status: AC
  Administered 2024-03-12: 5 mL
  Filled 2024-03-12: qty 5

## 2024-03-12 NOTE — Discharge Instructions (Addendum)
 Call and make an appointment with Dr. Jordis if any continued problems A prescription for antibiotics and pain medication was sent to the pharmacy.  The drain should be removed in 2 days.  If you have not seen at fall out you should return to the emergency department or see your primary care provider to have this removed.  As we discussed the pilonidal cyst is still there and we only drained it for the infection today.

## 2024-03-12 NOTE — ED Notes (Signed)
 See triage note  Presents with poss abscess    States he has a hx of the same  States this one started 2 days ago  Low grade temp on arrival

## 2024-03-12 NOTE — ED Triage Notes (Signed)
 Pt to ED for abscess to middle of buttocks. Seen here for same on 1/2. States also he usually has BM 3 times/day and yesterday only had 1 BM and painful to sit.

## 2024-03-12 NOTE — ED Provider Notes (Signed)
 "  [facility] Provider Note    Event Date/Time   First MD Initiated Contact with Patient 03/12/24 [phone]     (approximate)   History   Abscess   HPI  Jondavid Schreier is a 39 y.o. male presents to the ED with complaint of an abscess that has been intermittent for approximately 1 month.  Patient states that he was given antibiotics but did not have the area I&D.  Patient states that now he is unable to sit without pain.  Patient has a history of depression, anxiety, EtOH abuse, GERD, sickle cell trait.     Physical Exam   Triage Vital Signs: ED Triage Vitals  Encounter Vitals Group     BP 03/12/24 0839 (!) 150/101     Girls Systolic BP Percentile --      Girls Diastolic BP Percentile --      Boys Systolic BP Percentile --      Boys Diastolic BP Percentile --      Pulse Rate 03/12/24 0839 (!) 120     Resp 03/12/24 0839 20     Temp 03/12/24 0839 99.1 F (37.3 C)     Temp Source 03/12/24 0839 Oral     SpO2 03/12/24 0839 98 %     Weight 03/12/24 0838 202 lb 13.2 oz (92 kg)     Height 03/12/24 0838 5' 6 (1.676 m)     Head Circumference --      Peak Flow --      Pain Score --      Pain Loc --      Pain Education --      Exclude from Growth Chart --     Most recent vital signs: Vitals:   03/12/24 0839  BP: (!) 150/101  Pulse: (!) 120  Resp: 20  Temp: 99.1 F (37.3 C)  SpO2: 98%     General: Awake, no distress.  CV:  Good peripheral perfusion.  Resp:  Normal effort.  Abd:  No distention.  Other:  There is a very tender pilonidal abscess present without surrounding cellulitis at this time.  Area is fluctuant.  I discussed I&D with patient.   ED Results / Procedures / Treatments   Labs (all labs ordered are listed, but only abnormal results are displayed) Labs Reviewed - No data to display    PROCEDURES:  Critical Care performed:   .Incision and Drainage  Date/Time: 03/12/2024 11:00 AM  Performed by: Saunders Shona CROME,  PA-C Authorized by: Saunders Shona CROME, PA-C   Consent:    Consent obtained:  Verbal   Consent given by:  Patient   Risks discussed:  Incomplete drainage, infection and pain Universal protocol:    Patient identity confirmed:  Verbally with patient Location:    Type:  Pilonidal cyst   Location:  Anogenital   Anogenital location:  Pilonidal Pre-procedure details:    Skin preparation:  Antiseptic wash and povidone-iodine Sedation:    Sedation type:  None Anesthesia:    Anesthesia method:  Topical application and local infiltration   Topical anesthetic:  1 Application lidocaine -prilocaine  2.5-2.5 %   Local anesthetic:  3 mL lidocaine  (PF) 1 % Procedure type:    Complexity:  Simple Procedure details:    Incision types:  Stab incision   Incision depth:  Dermal   Wound management:  Probed and deloculated   Drainage:  Purulent   Drainage amount:  Copious   Wound treatment:  Drain placed   Packing  materials:  1/4 in iodoform gauze Post-procedure details:    Procedure completion:  Tolerated Comments:     Lynwood Smaller, PA-S was present and I & D the abscess.      MEDICATIONS ORDERED IN ED: Medications  lidocaine -prilocaine  (EMLA ) cream ( Topical Given 03/12/24 0950)  lidocaine  (PF) (XYLOCAINE ) 1 % injection 5 mL (5 mLs Infiltration Given 03/12/24 0950)  oxyCODONE -acetaminophen  (PERCOCET/ROXICET) 5-325 MG per tablet 1 tablet (1 tablet Oral Given 03/12/24 0950)     IMPRESSION / MDM / ASSESSMENT AND PLAN / ED COURSE  I reviewed the triage vital signs and the nursing notes.   Differential diagnosis includes, but is not limited to, pilonidal cyst, pilonidal abscess, cellulitis  39 year old male presents to the ED with pilonidal abscess.  Area was I&D need and copious amount of purulent material was removed.  Patient was placed on clindamycin  and Percocet as needed for pain.  He was told to return to the emergency department in 2 days for packing removal.  He was also given the surgeon  on-call for follow-up as he was told that pilonidal abscesses and cyst are recurrent.  Prescription for Percocet was also sent to the pharmacy.      Patient's presentation is most consistent with acute illness / injury with system symptoms.  FINAL CLINICAL IMPRESSION(S) / ED DIAGNOSES   Final diagnoses:  Pilonidal abscess     Rx / DC Orders   ED Discharge Orders          Ordered    oxyCODONE -acetaminophen  (PERCOCET) 5-325 MG tablet  Every 6 hours PRN        03/12/24 1137    clindamycin  (CLEOCIN ) 300 MG capsule  3 times daily        03/12/24 1137             Note:  This document was prepared using Dragon voice recognition software and may include unintentional dictation errors.   Saunders Shona CROME, PA-C 03/12/24 1346    Jacolyn Pae, MD 03/12/24 1521  "
